# Patient Record
Sex: Female | Born: 1955 | Race: White | Hispanic: No | Marital: Married | Smoking: Former smoker
Health system: Southern US, Community
[De-identification: ages and names within clinical notes are randomized; demographics above are authoritative.]

## PROBLEM LIST (undated history)

## (undated) DIAGNOSIS — E079 Disorder of thyroid, unspecified: Secondary | ICD-10-CM

## (undated) HISTORY — PX: BREAST REDUCTION SURGERY: SHX8

## (undated) HISTORY — PX: BREAST LUMPECTOMY: SHX2

## (undated) HISTORY — PX: RECTAL SURGERY: SHX760

---

## 1983-01-26 HISTORY — PX: PARTIAL HYSTERECTOMY: SHX80

## 2008-05-11 ENCOUNTER — Emergency Department (HOSPITAL_BASED_OUTPATIENT_CLINIC_OR_DEPARTMENT_OTHER): Admission: EM | Admit: 2008-05-11 | Discharge: 2008-05-11 | Payer: Self-pay | Admitting: Emergency Medicine

## 2010-11-03 ENCOUNTER — Emergency Department (INDEPENDENT_AMBULATORY_CARE_PROVIDER_SITE_OTHER): Payer: BC Managed Care – PPO

## 2010-11-03 ENCOUNTER — Encounter: Payer: Self-pay | Admitting: Emergency Medicine

## 2010-11-03 ENCOUNTER — Emergency Department (HOSPITAL_BASED_OUTPATIENT_CLINIC_OR_DEPARTMENT_OTHER)
Admission: EM | Admit: 2010-11-03 | Discharge: 2010-11-03 | Disposition: A | Discharge status: Home / Self Care | Payer: BC Managed Care – PPO | Attending: Emergency Medicine | Admitting: Emergency Medicine

## 2010-11-03 DIAGNOSIS — R197 Diarrhea, unspecified: Secondary | ICD-10-CM

## 2010-11-03 DIAGNOSIS — Z79899 Other long term (current) drug therapy: Secondary | ICD-10-CM | POA: Insufficient documentation

## 2010-11-03 DIAGNOSIS — B379 Candidiasis, unspecified: Secondary | ICD-10-CM

## 2010-11-03 DIAGNOSIS — K6289 Other specified diseases of anus and rectum: Secondary | ICD-10-CM

## 2010-11-03 DIAGNOSIS — K649 Unspecified hemorrhoids: Secondary | ICD-10-CM

## 2010-11-03 DIAGNOSIS — K648 Other hemorrhoids: Secondary | ICD-10-CM | POA: Insufficient documentation

## 2010-11-03 HISTORY — DX: Disorder of thyroid, unspecified: E07.9

## 2010-11-03 LAB — URINALYSIS, ROUTINE W REFLEX MICROSCOPIC
Glucose, UA: NEGATIVE mg/dL
Protein, ur: NEGATIVE mg/dL
Specific Gravity, Urine: 1.022 (ref 1.005–1.030)
pH: 6 (ref 5.0–8.0)

## 2010-11-03 LAB — BASIC METABOLIC PANEL
CO2: 27 mEq/L (ref 19–32)
Calcium: 10.4 mg/dL (ref 8.4–10.5)
Chloride: 104 mEq/L (ref 96–112)
Glucose, Bld: 109 mg/dL — ABNORMAL HIGH (ref 70–99)
Potassium: 3.5 mEq/L (ref 3.5–5.1)
Sodium: 139 mEq/L (ref 135–145)

## 2010-11-03 LAB — URINE MICROSCOPIC-ADD ON

## 2010-11-03 LAB — CBC
Hemoglobin: 15.4 g/dL — ABNORMAL HIGH (ref 12.0–15.0)
Platelets: 267 10*3/uL (ref 150–400)
RBC: 5.17 MIL/uL — ABNORMAL HIGH (ref 3.87–5.11)
WBC: 8.1 10*3/uL (ref 4.0–10.5)

## 2010-11-03 LAB — OCCULT BLOOD X 1 CARD TO LAB, STOOL: Fecal Occult Bld: POSITIVE

## 2010-11-03 MED ORDER — IOHEXOL 300 MG/ML  SOLN
100.0000 mL | Freq: Once | INTRAMUSCULAR | Status: AC | PRN
  Administered 2010-11-03: 100 mL via INTRAVENOUS

## 2010-11-03 MED ORDER — CLOTRIMAZOLE 1 % EX CREA: TOPICAL_CREAM | CUTANEOUS | Status: AC

## 2010-11-03 MED ORDER — SODIUM CHLORIDE 0.9 % IV SOLN
INTRAVENOUS | Status: DC
  Administered 2010-11-03: 18:00:00 via INTRAVENOUS

## 2010-11-03 MED ORDER — HYDROCODONE-ACETAMINOPHEN 5-500 MG PO TABS: 1.0000 | ORAL_TABLET | Freq: Four times a day (QID) | ORAL | Status: AC | PRN

## 2010-11-03 MED ORDER — ONDANSETRON HCL 4 MG/2ML IJ SOLN
4.0000 mg | Freq: Once | INTRAMUSCULAR | Status: AC
  Administered 2010-11-03: 4 mg via INTRAVENOUS
  Filled 2010-11-03: qty 2

## 2010-11-03 MED ORDER — MORPHINE SULFATE 4 MG/ML IJ SOLN
4.0000 mg | Freq: Once | INTRAMUSCULAR | Status: AC
  Administered 2010-11-03: 4 mg via INTRAVENOUS
  Filled 2010-11-03: qty 1

## 2010-11-03 NOTE — ED Provider Notes (Signed)
History     CSN: [id number] Arrival date & time: 11/03/2010  4:50 PM  Chief Complaint  Patient presents with  . Rectal Pain  . Diarrhea    (Consider location/radiation/quality/duration/timing/severity/associated sxs/prior treatment) HPI Comments: Pt states that she has 3 large bm 4 days ago:pt states that she started having rectal pain at that time:pt states that it is constant and worsening after diarrhea today:pt states that she feels like the inside of her rectum is on fire:pt states that she has lower abdominal pain earlier today:pt states that she has a history of internal hemorrhoids and has not had problem in years:pt states that she has used multiple over the counter medications  Patient is a 55 y.o. female presenting with diarrhea. The history is provided by the patient. No language interpreter was used.  Diarrhea The primary symptoms include diarrhea. Primary symptoms do not include nausea or vomiting. The illness began today. The problem has been gradually worsening.    Past Medical History  Diagnosis Date  . Thyroid disease   . Migraine     Past Surgical History  Procedure Date  . Rectal surgery     History reviewed. No pertinent family history.  History  Substance Use Topics  . Smoking status: Never Smoker   . Smokeless tobacco: Not on file  . Alcohol Use: No    OB History    Grav Para Term Preterm Abortions TAB SAB Ect Mult Living                  Review of Systems  Gastrointestinal: Positive for diarrhea. Negative for nausea and vomiting.  All other systems reviewed and are negative.    Allergies  Review of patient's allergies indicates no known allergies.  Home Medications   Current Outpatient Rx  Name Route Sig Dispense Refill  . ARMODAFINIL 150 MG PO TABS Oral Take 150 mg by mouth daily.      . ARMODAFINIL 250 MG PO TABS Oral Take 250 mg by mouth daily.      Marland Kitchen EXCEDRIN PO Oral Take 2 tablets by mouth every 6 (six) hours as needed. For pain      . LEVOTHYROXINE SODIUM 100 MCG PO TABS Oral Take 100 mcg by mouth daily.      Marland Kitchen LIOTHYRONINE SODIUM 25 MCG PO TABS Oral Take 25 mcg by mouth 2 (two) times daily.     . TOPIRAMATE 100 MG PO TABS Oral Take 200 mg by mouth at bedtime.     Marland Kitchen ZOLPIDEM TARTRATE 10 MG PO TABS Oral Take 10 mg by mouth at bedtime.      Marland Kitchen ZOLPIDEM TARTRATE 5 MG PO TABS Oral Take 5 mg by mouth at bedtime as needed.       BP 129/71  Pulse 82  Temp(Src) 97.5 F (36.4 C) (Oral)  Resp 22  Ht 5\' 4"  (1.626 m)  Wt 165 lb (74.844 kg)  BMI 28.32 kg/m2  SpO2 98%  Physical Exam  Nursing note and vitals reviewed. Constitutional: She is oriented to person, place, and time. She appears well-developed and well-nourished.  Eyes: Pupils are equal, round, and reactive to light.  Neck: Normal range of motion.  Cardiovascular: Normal rate and regular rhythm.   Pulmonary/Chest: Effort normal and breath sounds normal.  Abdominal: Soft. Bowel sounds are normal.  Genitourinary:       Internal hemorrhoid noted on exam  Musculoskeletal: Normal range of motion.  Neurological: She is alert and oriented to person, place, and time.  Skin:       Pt has erythematous inflamed rash to external rectum  Psychiatric: She has a normal mood and affect.    ED Course  Procedures (including critical care time)  Labs Reviewed  CBC - Abnormal; Notable for the following:    RBC 5.17 (*)    Hemoglobin 15.4 (*)    All other components within normal limits  BASIC METABOLIC PANEL - Abnormal; Notable for the following:    Glucose, Bld 109 (*)    GFR calc non Af Amer 55 (*)    GFR calc Af Amer 63 (*)    All other components within normal limits  OCCULT BLOOD X 1 CARD TO LAB, STOOL  URINALYSIS, ROUTINE W REFLEX MICROSCOPIC   Ct Abdomen Pelvis W Contrast  11/03/2010  *RADIOLOGY REPORT*  Clinical Data: Rectal pain.  Diarrhea.  CT ABDOMEN AND PELVIS WITH CONTRAST  Technique:  Multidetector CT imaging of the abdomen and pelvis was performed  following the standard protocol during bolus administration of intravenous contrast.  Contrast: OMNIPAQUE IOHEXOL 300 MG/ML IV SOLN  Comparison: None  Findings: Lung bases are clear.  No pleural or pericardial fluid. The liver has a normal appearance without focal lesions or biliary ductal dilatation.  The spleen is normal.  The pancreas is normal. The adrenal glands are normal.  The kidneys are normal except for a 5 mm cyst at the upper pole of the left kidney.  The aorta and IVC are normal.  No retroperitoneal mass or adenopathy.  No free intraperitoneal fluid or air.  No bowel pathology seen in the abdominal portion of the scan.  The appendix is normal.  In the pelvis, the rectosigmoid region appears normal.  No evidence of colitis or proctitis.  No evidence of perirectal abscess.  There is been previous hysterectomy.  No adnexal lesion.  The bladder appears unremarkable.  Bony structures are unremarkable except for degenerative disc disease at L5-S1.  IMPRESSION: No acute or significant finding.  No rectosigmoid pathology evident.  Original Report Authenticated By: Thomasenia Sales, M.D.     No diagnosis found.    MDM  No acute rectal or abdominal finding:will treat for yeast in the area:pt okay to follow up as needed        Teressa Lower, NP 11/03/10 1919

## 2010-11-03 NOTE — ED Notes (Signed)
Pt having rectal pain since Friday.  Pt states she had three large BMs on Friday and now is having rectal pain with burning sensation.  Pt is also having diarrhea today.  No known fever.  No N/V.

## 2010-11-03 NOTE — ED Notes (Signed)
Pt informed that she needs to provide urine specimen asap.  Pt verbalized understanding of the instructions, pt is currently drinking ct contrast.  No c/o of nausea, rates pain at 8/10.

## 2010-11-10 NOTE — ED Provider Notes (Signed)
Medical screening examination/treatment/procedure(s) were performed by non-physician practitioner and as supervising physician I was immediately available for consultation/collaboration.  Hurman Horn, MD 11/10/10 2114

## 2012-03-09 IMAGING — CT CT ABD-PELV W/ CM
2 of 5 series · 16 of 46 positions shown, 18 images · IV contrast (APPLIED)
Comparison: None

CLINICAL DATA: Rectal pain.  Diarrhea.

CT ABDOMEN AND PELVIS WITH CONTRAST
TECHNIQUE: Multidetector CT imaging of the abdomen and pelvis was
performed following the standard protocol during bolus
administration of intravenous contrast.
Contrast: 100mL OMNIPAQUE IOHEXOL 300 MG/ML IV SOLN

[Series 2: abd/pelvis 5.0 b31f · axial · 0.69mm/px · z∈[-490,-100]mm · 13 of 88 slices shown, 15 images]
[im 5/88  soft-tissue]
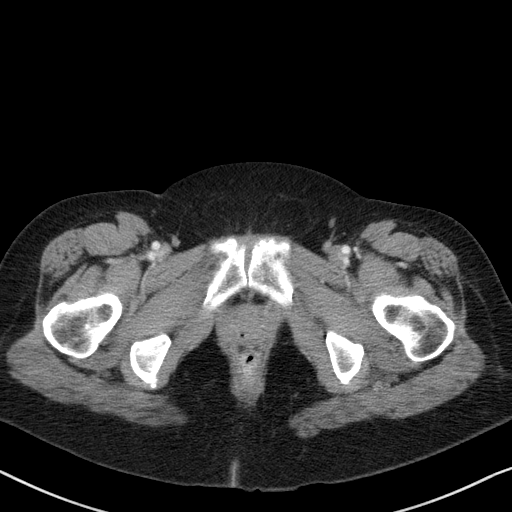
[im 5/88  bone]
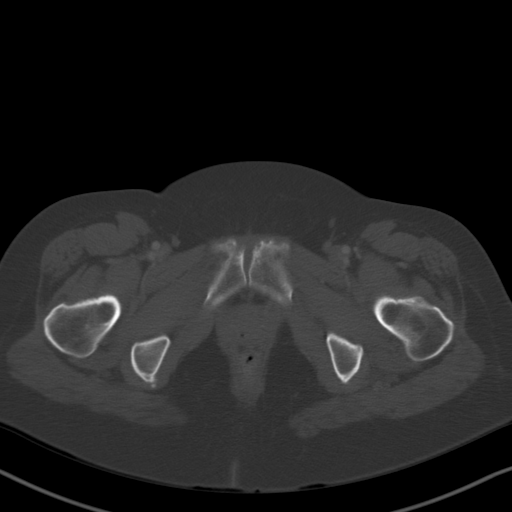
[im 14/88  soft-tissue]
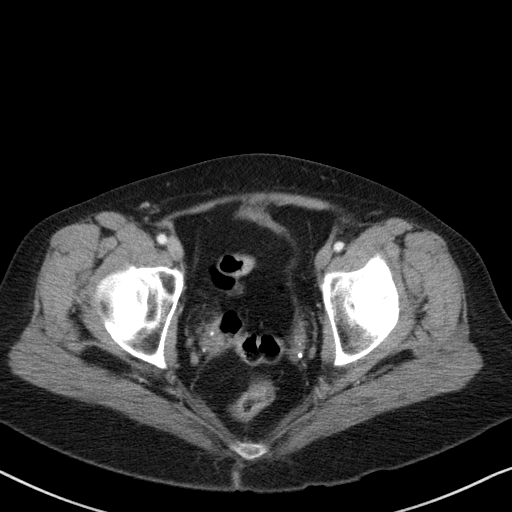
[im 19/88  soft-tissue]
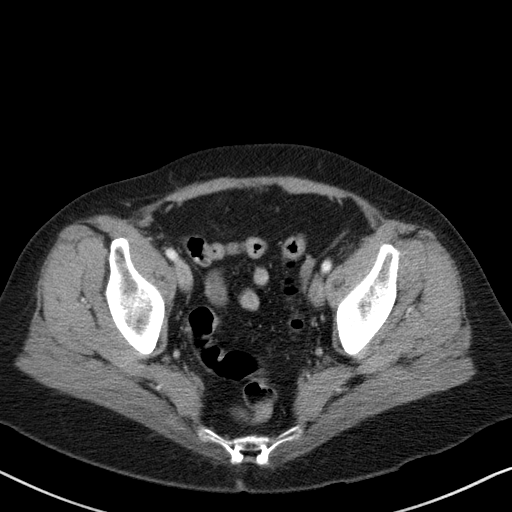
[im 23/88  soft-tissue]
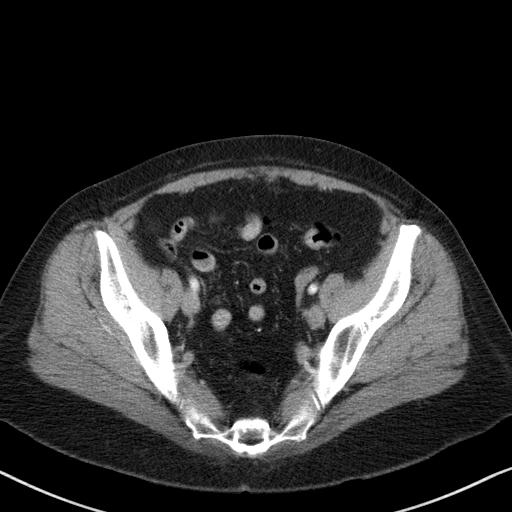
[im 33/88  soft-tissue]
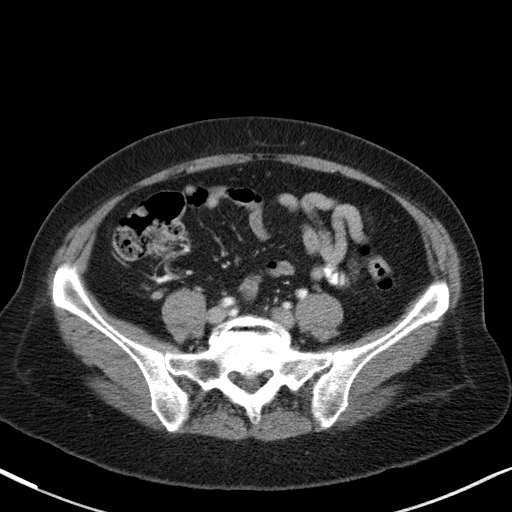
[im 37/88  soft-tissue]
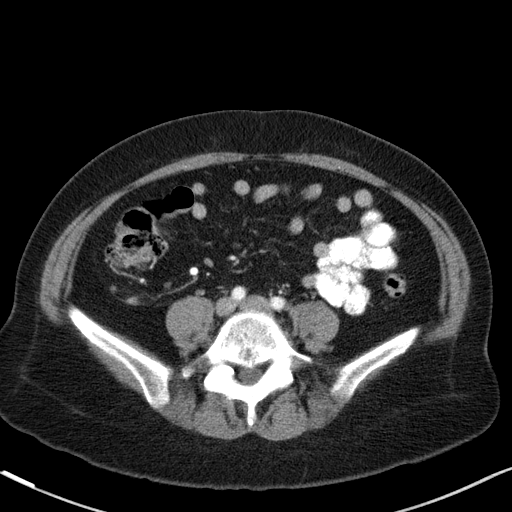
[im 46/88  soft-tissue]
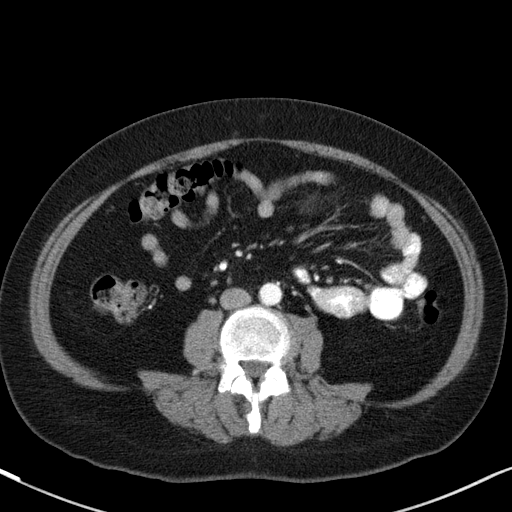
[im 51/88  soft-tissue]
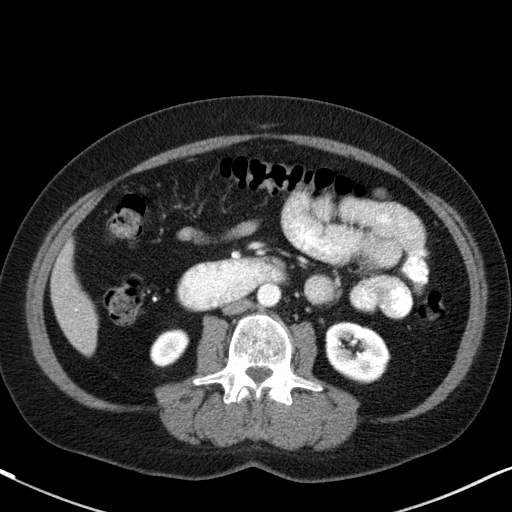
[im 55/88  soft-tissue]
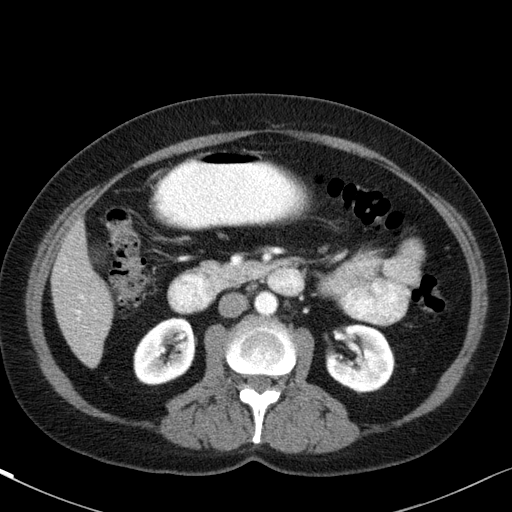
[im 55/88  bone]
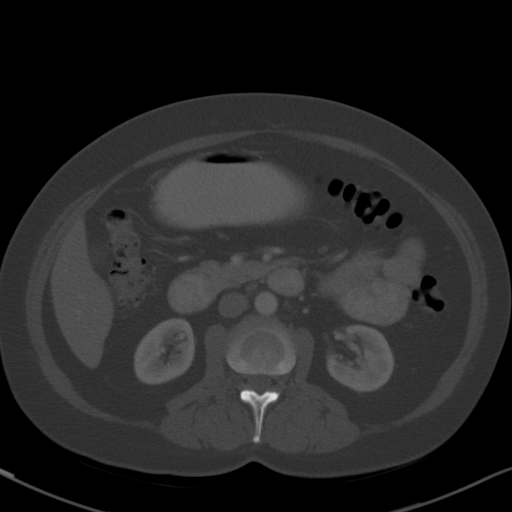
[im 65/88  soft-tissue]
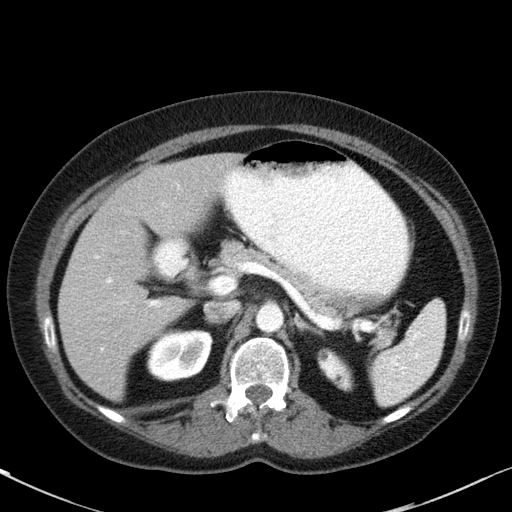
[im 69/88  soft-tissue]
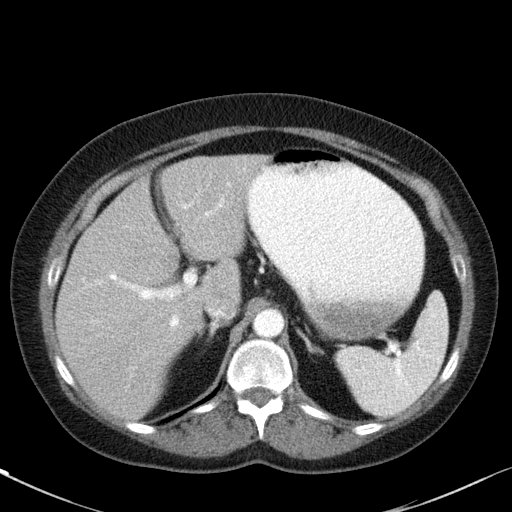
[im 74/88  soft-tissue]
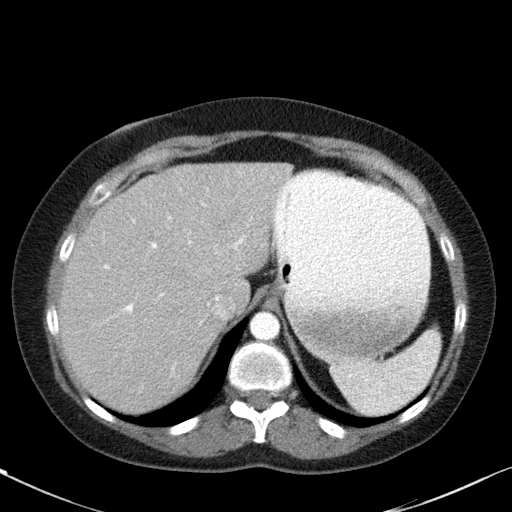
[im 83/88  soft-tissue]
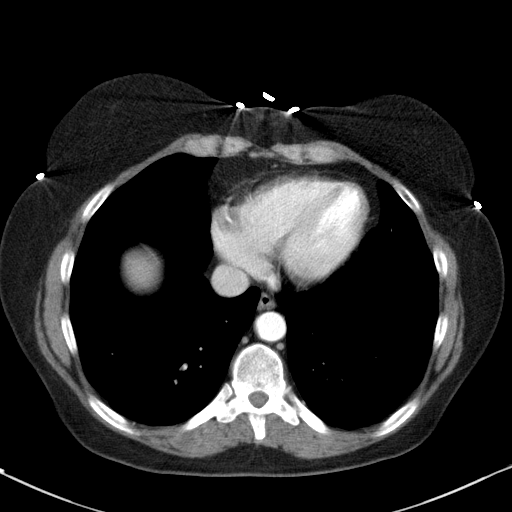

[Series 5: abd/pelvis 3.0 coronal · coronal · 0.68mm/px · 3 of 91 slices shown]
[im 31/91  soft-tissue]
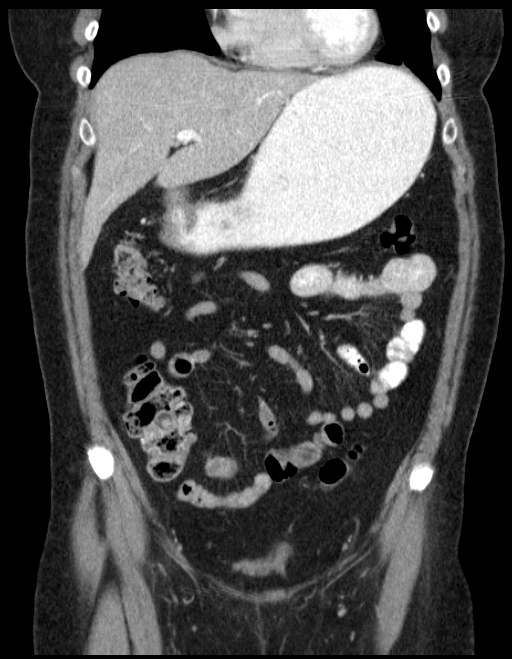
[im 41/91  soft-tissue]
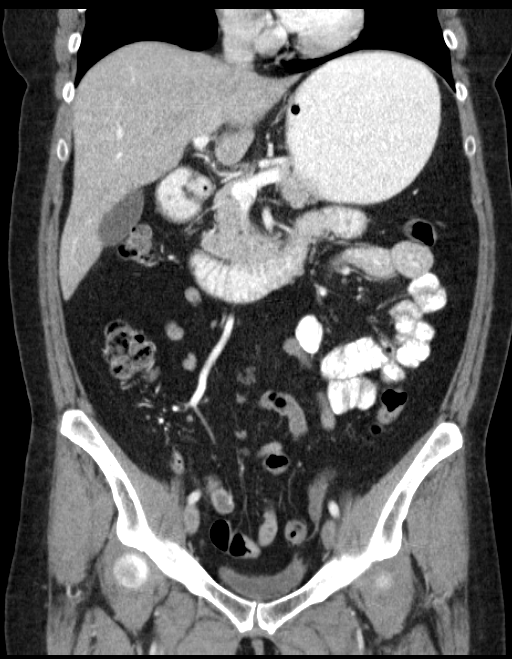
[im 51/91  soft-tissue]
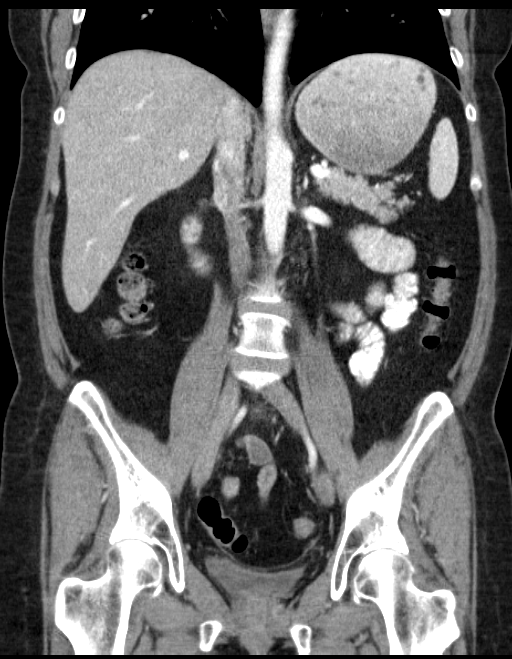

[16 of 46 positions shown; findings below may reference images not displayed]

FINDINGS: Lung bases are clear.  No pleural or pericardial fluid.
The liver has a normal appearance without focal lesions or biliary
ductal dilatation.  The spleen is normal.  The pancreas is normal.
The adrenal glands are normal.  The kidneys are normal except for a
5 mm cyst at the upper pole of the left kidney.  The aorta and IVC
are normal.  No retroperitoneal mass or adenopathy.  No free
intraperitoneal fluid or air.  No bowel pathology seen in the
abdominal portion of the scan.  The appendix is normal.

In the pelvis, the rectosigmoid region appears normal.  No evidence
of colitis or proctitis.  No evidence of perirectal abscess.  There
is been previous hysterectomy.  No adnexal lesion.  The bladder
appears unremarkable.

Bony structures are unremarkable except for degenerative disc
disease at L5-S1.
IMPRESSION: No acute or significant finding.  No rectosigmoid pathology
evident.

## 2015-03-31 DIAGNOSIS — G43709 Chronic migraine without aura, not intractable, without status migrainosus: Secondary | ICD-10-CM | POA: Insufficient documentation

## 2015-03-31 DIAGNOSIS — E782 Mixed hyperlipidemia: Secondary | ICD-10-CM | POA: Insufficient documentation

## 2015-03-31 DIAGNOSIS — N1831 Chronic kidney disease, stage 3a: Secondary | ICD-10-CM | POA: Insufficient documentation

## 2015-03-31 DIAGNOSIS — E039 Hypothyroidism, unspecified: Secondary | ICD-10-CM | POA: Insufficient documentation

## 2015-03-31 DIAGNOSIS — H8109 Meniere's disease, unspecified ear: Secondary | ICD-10-CM | POA: Insufficient documentation

## 2015-05-15 DIAGNOSIS — M51369 Other intervertebral disc degeneration, lumbar region without mention of lumbar back pain or lower extremity pain: Secondary | ICD-10-CM | POA: Insufficient documentation

## 2016-12-24 DIAGNOSIS — G8929 Other chronic pain: Secondary | ICD-10-CM | POA: Insufficient documentation

## 2017-11-18 DIAGNOSIS — H02402 Unspecified ptosis of left eyelid: Secondary | ICD-10-CM | POA: Insufficient documentation

## 2018-08-24 DIAGNOSIS — K219 Gastro-esophageal reflux disease without esophagitis: Secondary | ICD-10-CM | POA: Insufficient documentation

## 2018-10-06 DIAGNOSIS — M8589 Other specified disorders of bone density and structure, multiple sites: Secondary | ICD-10-CM | POA: Insufficient documentation

## 2020-01-23 DIAGNOSIS — N904 Leukoplakia of vulva: Secondary | ICD-10-CM | POA: Insufficient documentation

## 2020-01-23 DIAGNOSIS — G4440 Drug-induced headache, not elsewhere classified, not intractable: Secondary | ICD-10-CM | POA: Insufficient documentation

## 2020-05-05 ENCOUNTER — Encounter: Payer: Self-pay | Admitting: Neurology

## 2020-05-05 ENCOUNTER — Ambulatory Visit: Payer: BC Managed Care – PPO | Admitting: Neurology

## 2020-05-05 VITALS — BP 117/68 | HR 81 | Ht 64.0 in | Wt 178.5 lb

## 2020-05-05 DIAGNOSIS — G43709 Chronic migraine without aura, not intractable, without status migrainosus: Secondary | ICD-10-CM | POA: Diagnosis not present

## 2020-05-05 DIAGNOSIS — G4700 Insomnia, unspecified: Secondary | ICD-10-CM

## 2020-05-05 MED ORDER — SUMATRIPTAN SUCCINATE 100 MG PO TABS: ORAL_TABLET | ORAL | 3 refills | Status: DC

## 2020-05-05 MED ORDER — EMGALITY 120 MG/ML ~~LOC~~ SOAJ: 1.0000 "pen " | SUBCUTANEOUS | 4 refills | Status: DC

## 2020-05-05 NOTE — Progress Notes (Signed)
[facility]  PATIENT: Cindy Haley DOB: 1955-12-16  REFERRING DOCTOR OR PCP:  Tilford Pillar, MD (PCP) SOURCE: patient, notes from PCP  _________________________________   HISTORICAL  CHIEF COMPLAINT:  Chief Complaint  Patient presents with  . New Patient (Initial Visit)    RM 13, alone. Paper referral from Tilford Pillar, MD for migraines. Takes sumatriptan 50mg  and excedrin migraine every morning. Also has been on topamax for years. Has daily headaches. Will also wake up with headaches.     HISTORY OF PRESENT ILLNESS:  I had the pleasure of seeing your patient, Cindy Haley, at [facility] for neurologic consultation regarding her chronic migraines.  She is a 65 year old woman who has had migraine headaches > 15 years.  They became daily about 10 years ago.    A typical headache is above the right eye and has a throbbing quality.   She gets nausea and occasioanl vomiting.   She has photophobia, phonophobia and moving worsens the pain.    She wakes up most mornings with headaches.  She has HA 30/30 days and they are > 4 hours a day.    She has been taking sumatriptan 50 mg and Excedrin po qAM.   That combination helps the HA in the morning and she can usually work during tehday.  The HA may return later in the day.    She takes a triptans with benefit when a migraine occurs.     We had done Botox infusions in the past with benefit but insurance stopped covering them about 4 years ago.  When she stopped the Botox the headache frequency increased again..    She has not tried an anti-CGRP agent.      In the past, she tried and failed the following: Anti-Epileptic drugs: Depakote, Keppra and Zonisamide.   Currently on topiramate and gabapentin without much benefit Tricyclics/antidepressants: Amitriptyline, Duloxetine, Bupropion NSAIDs including piroxicam, Ibuprofen Steroid shots/pills Triptans: Imitrex (short benefit only) Opiates  (hydrocodone (short benefit only).  Muscle relaxant: Baclofen Allergy and sinus med's: Claritin, Allegra, fluticasone spray  She is otherwise in good health.      CT 12/02/2017 report read 'negative intracranial imaging'  REVIEW OF SYSTEMS: Constitutional: No fevers, chills, sweats, or change in appetite.  She has insomnia. Eyes: No visual changes, double vision, eye pain Ear, nose and throat: No hearing loss, ear pain, nasal congestion, sore throat Cardiovascular: No chest pain, palpitations Respiratory: No shortness of breath at rest or with exertion.   No wheezes GastrointestinaI: No nausea, vomiting, diarrhea, abdominal pain, fecal incontinence Genitourinary: No dysuria, urinary retention or frequency.  No nocturia. Musculoskeletal: No neck pain, back pain Integumentary: No rash, pruritus, skin lesions Neurological: as above Psychiatric: No depression at this time.  No anxiety Endocrine: No palpitations, diaphoresis, change in appetite, change in weigh or increased thirst Hematologic/Lymphatic: No anemia, purpura, petechiae. Allergic/Immunologic: No itchy/runny eyes, nasal congestion, recent allergic reactions, rashes  ALLERGIES: No Known Allergies  HOME MEDICATIONS:  Current Outpatient Medications:  .  Aspirin-Acetaminophen-Caffeine (EXCEDRIN PO), Take 2 tablets by mouth every morning. For pain, Disp: , Rfl:  .  gabapentin (NEURONTIN) 400 MG capsule, Take 800 mg by mouth at bedtime., Disp: , Rfl:  .  Galcanezumab-gnlm (EMGALITY) 120 MG/ML SOAJ, Inject 1 pen into the skin every 28 (twenty-eight) days., Disp: 3 mL, Rfl: 4 .  levothyroxine (SYNTHROID, LEVOTHROID) 100 MCG tablet, Take 100 mcg by mouth daily., Disp: , Rfl:  .  topiramate (TOPAMAX) 100 MG tablet,  Take 200 mg by mouth at bedtime., Disp: , Rfl:  .  zolpidem (AMBIEN) 10 MG tablet, Take 10 mg by mouth at bedtime., Disp: , Rfl:  .  SUMAtriptan (IMITREX) 100 MG tablet, Takes 1/2 to one tablet prn migraine.  No more  than 3 times a week, Disp: 10 tablet, Rfl: 3  PAST MEDICAL HISTORY: Past Medical History:  Diagnosis Date  . Migraine   . Thyroid disease     PAST SURGICAL HISTORY: Past Surgical History:  Procedure Laterality Date  . BREAST LUMPECTOMY     90's  . BREAST REDUCTION SURGERY     90's  . CESAREAN SECTION     1976, 1981  . PARTIAL HYSTERECTOMY  1985  . RECTAL SURGERY      FAMILY HISTORY: Family History  Problem Relation Age of Onset  . Anuerysm Father   . Kidney disease Brother   . Alcohol abuse Brother     SOCIAL HISTORY:  Social History   Socioeconomic History  . Marital status: Married    Spouse name: Renae Fickle  . Number of children: 2  . Years of education: Master's  . Highest education level: Not on file  Occupational History  . Occupation: [employer]  Tobacco Use  . Smoking status: Former Games developer  . Smokeless tobacco: Never Used  Substance and Sexual Activity  . Alcohol use: No  . Drug use: Never  . Sexual activity: Not on file  Other Topics Concern  . Not on file  Social History Narrative   2 cups coffee per day   Lives with spouse   Right handed   Social Determinants of Health   Financial Resource Strain: Not on file  Food Insecurity: Not on file  Transportation Needs: Not on file  Physical Activity: Not on file  Stress: Not on file  Social Connections: Not on file  Intimate Partner Violence: Not on file     PHYSICAL EXAM  Vitals:   05/05/20 1037  BP: 117/68  Pulse: 81  Weight: 178 lb 8 oz (81 kg)  Height: 5\' 4"  (1.626 m)    Body mass index is 30.64 kg/m.   Hearing Screening   125Hz  250Hz  500Hz  1000Hz  2000Hz  3000Hz  4000Hz  6000Hz  8000Hz   Right ear:           Left ear:             Visual Acuity Screening   Right eye Left eye Both eyes  Without correction:     With correction: 20/70 20/200 20/50    General: The patient is well-developed and well-nourished and in no acute distress  HEENT:  Head is [place]/AT.  Sclera are  anicteric.  Funduscopic exam shows normal optic discs and retinal vessels.  Neck: No carotid bruits are noted.  The neck is nontender.  Cardiovascular: The heart has a regular rate and rhythm with a normal S1 and S2. There were no murmurs, gallops or rubs.    Skin: Extremities are without rash or  edema.  Musculoskeletal:  Back is nontender  Neurologic Exam  Mental status: The patient is alert and oriented x 3 at the time of the examination. The patient has apparent normal recent and remote memory, with an apparently normal attention span and concentration ability.   Speech is normal.  Cranial nerves: Extraocular movements are full. Pupils are equal, round, and reactive to light and accomodation.  Visual fields are full.  Facial symmetry is present. There is good facial sensation to soft touch  bilaterally.Facial strength is normal.  Trapezius and sternocleidomastoid strength is normal. No dysarthria is noted.  The tongue is midline, and the patient has symmetric elevation of the soft palate. No obvious hearing deficits are noted.  Motor:  Muscle bulk is normal.   Tone is normal. Strength is  5 / 5 in all 4 extremities.   Sensory: Sensory testing is intact to pinprick, soft touch and vibration sensation in all 4 extremities.  Coordination: Cerebellar testing reveals good finger-nose-finger and heel-to-shin bilaterally.  Gait and station: Station is normal.   Gait is normal. Tandem gait is normal. Romberg is negative.   Reflexes: Deep tendon reflexes are symmetric and normal bilaterally.   Plantar responses are flexor.    DIAGNOSTIC DATA (LABS, IMAGING, TESTING) - I reviewed patient records, labs, notes, testing and imaging myself where available.  Lab Results  Component Value Date   WBC 8.1 11/03/2010   HGB 15.4 (H) 11/03/2010   HCT 44.2 11/03/2010   MCV 85.5 11/03/2010   PLT 267 11/03/2010      Component Value Date/Time   NA 139 11/03/2010 1730   K 3.5 11/03/2010 1730   CL  104 11/03/2010 1730   CO2 27 11/03/2010 1730   GLUCOSE 109 (H) 11/03/2010 1730   BUN 21 11/03/2010 1730   CREATININE 1.10 11/03/2010 1730   CALCIUM 10.4 11/03/2010 1730   GFRNONAA 55 (L) 11/03/2010 1730   GFRAA 63 (L) 11/03/2010 1730       ASSESSMENT AND PLAN  Chronic migraine w/o aura w/o status migrainosus, not intractable  Insomnia, unspecified type    In summary, Cindy Haley is a 65 year old woman with a long history of chronic migraine.  In the past, she had done best on Botox.  However, insurance stopped covering the injections a few years ago.  She has tried and failed multiple prophylactic agents.  She has not been on an agent that works on the CGRP pathways.   1.  Emgality 2 x 120 mg loading dose sample  ([id number] M   01/08/2022) and prescription for continuing dose.   Could consider Ajovy or Aimovig if preferred by insurance.  .  If this does not help will reconsider Botox.  For now she will continue the topiramate but this may be discontinued if headaches improve on Emgality or similar medication. 2.   She is advised to reserve the Sumatriptan for breakthrough migraines.  Also try to take Excedrin only when needed 3.    RTC 6 months or sooner if new or worsening neurologic issues or if anti-CGRP agents do not work and we start Botox.     Thank you for asking me to see Cindy Haley .  Please let me know if I can be of further assistance with her or other patients in the future.  Shawne Bulow A. Epimenio Foot, MD, [facility] 05/05/2020, 5:21 PM Certified in Neurology, Clinical Neurophysiology, Sleep Medicine and Neuroimaging  [facility] [address], [address] [place], [place] [address] [phone]

## 2020-06-02 ENCOUNTER — Telehealth: Payer: Self-pay | Admitting: Neurology

## 2020-06-02 ENCOUNTER — Other Ambulatory Visit: Payer: Self-pay | Admitting: *Deleted

## 2020-06-02 MED ORDER — TOPIRAMATE 100 MG PO TABS: 200.0000 mg | ORAL_TABLET | Freq: Every day | ORAL | 3 refills | Status: AC

## 2020-06-02 NOTE — Telephone Encounter (Signed)
Called pt and LVM letting her know we received message and sent in refill to pharmacy.

## 2020-06-02 NOTE — Telephone Encounter (Signed)
Pt called stating that her PCP will not fill her medication any longer and is needing her medication filled with provider here. Pt states that she is needing to have this filled before she can go in to work today. Pt is needing this filled at the [facility] in [place] on [address].

## 2020-09-24 ENCOUNTER — Other Ambulatory Visit: Payer: Self-pay | Admitting: Neurology

## 2020-11-03 ENCOUNTER — Ambulatory Visit: Payer: BC Managed Care – PPO | Admitting: Neurology

## 2020-11-10 ENCOUNTER — Other Ambulatory Visit: Payer: Self-pay

## 2020-11-10 ENCOUNTER — Encounter: Payer: Self-pay | Admitting: Neurology

## 2020-11-10 ENCOUNTER — Telehealth: Payer: Self-pay | Admitting: Neurology

## 2020-11-10 ENCOUNTER — Ambulatory Visit: Payer: BC Managed Care – PPO | Admitting: Neurology

## 2020-11-10 VITALS — BP 119/76 | HR 77 | Ht 64.0 in | Wt 177.6 lb

## 2020-11-10 DIAGNOSIS — G43709 Chronic migraine without aura, not intractable, without status migrainosus: Secondary | ICD-10-CM

## 2020-11-10 DIAGNOSIS — G4700 Insomnia, unspecified: Secondary | ICD-10-CM | POA: Diagnosis not present

## 2020-11-10 MED ORDER — DOXEPIN HCL 10 MG PO CAPS: 10.0000 mg | ORAL_CAPSULE | Freq: Every day | ORAL | 3 refills | Status: DC

## 2020-11-10 MED ORDER — SUMATRIPTAN SUCCINATE 100 MG PO TABS: ORAL_TABLET | ORAL | 3 refills | Status: DC

## 2020-11-10 NOTE — Telephone Encounter (Signed)
Received signed patient consent (given to medical records) and charge sheet for 200 units of Botox ([id number]) for chronic migraine (G43.709). MD informed me that patient will go on Medicare in the new year, he would like to make sure patient has Botox before then. We will not use a specialty pharmacy to obtain Botox because of this. Patient has BCBS [place] currently. I have filled out PA form and gave to MD to sign.

## 2020-11-10 NOTE — Progress Notes (Signed)
[facility]  PATIENT: Cindy Haley DOB: May 27, 1955  REFERRING DOCTOR OR PCP:  Tilford Pillar, MD (PCP) SOURCE: patient, notes from PCP  _________________________________   HISTORICAL  CHIEF COMPLAINT:  Chief Complaint  Patient presents with   Follow-up    New rm, alone. Here for 6 month migraine f/u. Pt reports initial dose of Emgality worked well, thereafter only causing a bruise and good for 2-3 days. Has been taking 1/2 tablet of Imitrex and 1 Excedrin and seems to work better.     HISTORY OF PRESENT ILLNESS:  Cindy Haley is a 65 y.o. woman with chronic migraines.  Update 11/10/2020: At the last visit, Emgality was tried.   Her forst month she felt she did better but then subsequent shots seemed less effective.   She has migraine 30/30 days a month.   Some days she wakes up HA free and it develops as the day goes on.   Often, she wakes up with the headache and it sometimes awakens her.     Botox has been the most effective fir the chronic migraine and she would like to get back on it.     She has insomnia and often wakes up around 4 am and then can't fall back asleep again.   She falls asleep fairly easily now but needs to take Ambien.  Without it she has trouble initiating asleep  HISTORY OF MIGRAINE She has had migraine headaches since around 2000.  They became daily about 2010/2011 years ago.    A typical headache is above the right eye and has a throbbing quality.   She gets nausea and occasioanl vomiting.   She has photophobia, phonophobia and moving worsens the pain.    She wakes up most mornings with headaches.  She has HA 30/30 days and they are > 4 hours a day.    She has been taking sumatriptan 50 mg and Excedrin po qAM.   That combination helps the HA in the morning and she can usually work during tehday.  The HA may return later in the day.    She takes a triptans with benefit when a migraine occurs.     We had done Botox infusions in the  past with benefit but insurance stopped covering them about 4 years ago.  When she stopped the Botox the headache frequency increased again..     In the past, she tried and failed the following: Anti-Epileptic drugs: Depakote, Keppra and Zonisamide.   Currently on topiramate and gabapentin without much benefit Tricyclics/antidepressants: Amitriptyline, Duloxetine, Bupropion NSAIDs including piroxicam, Ibuprofen Steroid shots/pills Triptans: Imitrex (short benefit only) Anti-CGRP agents:  Emgality only helped slightly the first month and not with subsequent months.   Opiates (hydrocodone (short benefit only).  Muscle relaxant: Baclofen Allergy and sinus med's: Claritin, Allegra, fluticasone spray  She is otherwise in good health.      CT 12/02/2017 report read 'negative intracranial imaging'  REVIEW OF SYSTEMS: Constitutional: No fevers, chills, sweats, or change in appetite.  She has insomnia. Eyes: No visual changes, double vision, eye pain Ear, nose and throat: No hearing loss, ear pain, nasal congestion, sore throat Cardiovascular: No chest pain, palpitations Respiratory:  No shortness of breath at rest or with exertion.   No wheezes GastrointestinaI: No nausea, vomiting, diarrhea, abdominal pain, fecal incontinence Genitourinary:  No dysuria, urinary retention or frequency.  No nocturia. Musculoskeletal:  No neck pain, back pain Integumentary: No rash, pruritus, skin lesions Neurological: as above Psychiatric: No depression  at this time.  No anxiety Endocrine: No palpitations, diaphoresis, change in appetite, change in weigh or increased thirst Hematologic/Lymphatic:  No anemia, purpura, petechiae. Allergic/Immunologic: No itchy/runny eyes, nasal congestion, recent allergic reactions, rashes  ALLERGIES: No Known Allergies  HOME MEDICATIONS:  Current Outpatient Medications:    Aspirin-Acetaminophen-Caffeine (EXCEDRIN PO), Take 2 tablets by mouth every morning. For pain, Disp:  , Rfl:    doxepin (SINEQUAN) 10 MG capsule, Take 1 capsule (10 mg total) by mouth at bedtime., Disp: 90 capsule, Rfl: 3   gabapentin (NEURONTIN) 400 MG capsule, Take 800 mg by mouth at bedtime., Disp: , Rfl:    Galcanezumab-gnlm (EMGALITY) 120 MG/ML SOAJ, Inject 1 pen into the skin every 28 (twenty-eight) days., Disp: 3 mL, Rfl: 4   levothyroxine (SYNTHROID, LEVOTHROID) 100 MCG tablet, Take 100 mcg by mouth daily., Disp: , Rfl:    topiramate (TOPAMAX) 100 MG tablet, Take 2 tablets (200 mg total) by mouth at bedtime., Disp: 60 tablet, Rfl: 3   zolpidem (AMBIEN) 10 MG tablet, Take 10 mg by mouth at bedtime., Disp: , Rfl:    SUMAtriptan (IMITREX) 100 MG tablet, TAKES 1/2 TO ONE TABLET AS NEEDED FOR MIGRAINE. NO MORE THAN 3 TIMES A WEEK, Disp: 30 tablet, Rfl: 3  PAST MEDICAL HISTORY: Past Medical History:  Diagnosis Date   Migraine    Thyroid disease     PAST SURGICAL HISTORY: Past Surgical History:  Procedure Laterality Date   BREAST LUMPECTOMY     90's   BREAST REDUCTION SURGERY     90's   CESAREAN SECTION     1976, 1981   PARTIAL HYSTERECTOMY  1985   RECTAL SURGERY      FAMILY HISTORY: Family History  Problem Relation Age of Onset   Anuerysm Father    Kidney disease Brother    Alcohol abuse Brother     SOCIAL HISTORY:  Social History   Socioeconomic History   Marital status: Married    Spouse name: Renae Fickle   Number of children: 2   Years of education: Master's   Highest education level: Not on file  Occupational History   Occupation: Marine scientist  Tobacco Use   Smoking status: Former   Smokeless tobacco: Never  Substance and Sexual Activity   Alcohol use: No   Drug use: Never   Sexual activity: Not on file  Other Topics Concern   Not on file  Social History Narrative   2 cups coffee per day   Lives with spouse   Right handed   Social Determinants of Health   Financial Resource Strain: Not on file  Food Insecurity: Not on file  Transportation  Needs: Not on file  Physical Activity: Not on file  Stress: Not on file  Social Connections: Not on file  Intimate Partner Violence: Not on file     PHYSICAL EXAM  Vitals:   11/10/20 1003  BP: 119/76  Pulse: 77  Weight: 177 lb 9.6 oz (80.6 kg)  Height: 5\' 4"  (1.626 m)    Body mass index is 30.48 kg/m.  No results found.   General: The patient is well-developed and well-nourished and in no acute distress  HEENT:  Head is [place]/AT.  Sclera are anicteric.    Neck: The neck is nontender.  \Skin: Extremities are without rash or  edema.  Musculoskeletal:  Back is nontender  Neurologic Exam  Mental status: The patient is alert and oriented x 3 at the time of the examination. The patient has  apparent normal recent and remote memory, with an apparently normal attention span and concentration ability.   Speech is normal.  Cranial nerves: Extraocular movements are full.  Facial strength and sensation was normal..  The tongue is midline, and the patient has symmetric elevation of the soft palate. No obvious hearing deficits are noted.  Motor:  Muscle bulk is normal.   Tone is normal. Strength is  5 / 5 in all 4 extremities.   Sensory: Sensory testing is intact to pinprick, soft touch and vibration sensation in all 4 extremities.  Coordination: Cerebellar testing reveals good finger-nose-finger and heel-to-shin bilaterally.  Gait and station: Station is normal.   Gait is normal. Tandem gait is normal. Romberg is negative.   Reflexes: Deep tendon reflexes are symmetric and normal bilaterally.   Plantar responses are flexor.    DIAGNOSTIC DATA (LABS, IMAGING, TESTING) - I reviewed patient records, labs, notes, testing and imaging myself where available.  Lab Results  Component Value Date   WBC 8.1 11/03/2010   HGB 15.4 (H) 11/03/2010   HCT 44.2 11/03/2010   MCV 85.5 11/03/2010   PLT 267 11/03/2010      Component Value Date/Time   NA 139 11/03/2010 1730   K 3.5  11/03/2010 1730   CL 104 11/03/2010 1730   CO2 27 11/03/2010 1730   GLUCOSE 109 (H) 11/03/2010 1730   BUN 21 11/03/2010 1730   CREATININE 1.10 11/03/2010 1730   CALCIUM 10.4 11/03/2010 1730   GFRNONAA 55 (L) 11/03/2010 1730   GFRAA 63 (L) 11/03/2010 1730       ASSESSMENT AND PLAN  Chronic migraine w/o aura w/o status migrainosus, not intractable  Insomnia, unspecified type  Emgality has not helped her chronic migraine.  She has failed multiple other medications.  She had done reasonably well on Botox and was on that for several years.  We will see if we can get her preauthorized to resume that medication.   Sumatriptan as needed for migraine  Doxepin 10 mg nightly for sleep maintenance insomnia RTC 6 months or sooner if new or worsening neurologic issues or if anti-CGRP agents do not work and we start Botox.      Bairon Klemann A. Epimenio Foot, MD, [facility] 11/10/2020, 3:43 PM Certified in Neurology, Clinical Neurophysiology, Sleep Medicine and Neuroimaging  [facility] [address], [address] [place], [place] [address] [phone]

## 2020-11-11 NOTE — Telephone Encounter (Signed)
Faxed PA request to [facility] with notes.

## 2020-11-12 NOTE — Telephone Encounter (Signed)
Received approval from [facility]. PA reference [facility] (11/11/20- 04/28/21). I will call patient to schedule.

## 2020-11-17 NOTE — Telephone Encounter (Signed)
I called patient to schedule Botox appointment. She states she needs a Monday d/t work. I scheduled her for 9am on 12/7.

## 2020-12-01 ENCOUNTER — Encounter: Payer: Self-pay | Admitting: Neurology

## 2020-12-01 ENCOUNTER — Ambulatory Visit: Payer: BC Managed Care – PPO | Admitting: Neurology

## 2020-12-01 DIAGNOSIS — G43709 Chronic migraine without aura, not intractable, without status migrainosus: Secondary | ICD-10-CM | POA: Diagnosis not present

## 2020-12-01 DIAGNOSIS — G4700 Insomnia, unspecified: Secondary | ICD-10-CM | POA: Diagnosis not present

## 2020-12-01 NOTE — Progress Notes (Signed)
Botox- 200 units x 1 vial Lot: [id number] Expiration: 04/25 NDC: 0023-3921-02  0.9% Sodium Chloride- 4mL total Lot: [id number] Expiration: 12/23 NDC: 63323-186-03  Dx: G43.709 B/B  

## 2020-12-01 NOTE — Progress Notes (Signed)
[facility]  PATIENT: Cindy Haley DOB: 1956-01-20  REFERRING DOCTOR OR PCP:  Tilford Pillar, MD (PCP) SOURCE: patient, notes from PCP  _________________________________   HISTORICAL  CHIEF COMPLAINT:  Chief Complaint  Patient presents with   Procedure    Rm 2, alone. Here for Botox.     HISTORY OF PRESENT ILLNESS:  Cindy Haley is a 65 y.o. woman with chronic migraines.  Update 11/10/2020: At the last visit, Emgality was tried.   Her forst month she felt she did better but then subsequent shots seemed less effective.   She has migraine 30/30 days a month.   Some days she wakes up HA free and it develops as the day goes on.   Often, she wakes up with the headache and it sometimes awakens her.     Botox has been the most effective fir the chronic migraine and she would like to get back on it.     She has insomnia and often wakes up around 4 am and then can't fall back asleep again.   She falls asleep fairly easily now but needs to take Ambien.  Without it she has trouble initiating asleep  HISTORY OF MIGRAINE She has had migraine headaches since around 2000.  They became daily about 2010/2011 years ago.    A typical headache is above the right eye and has a throbbing quality.   She gets nausea and occasioanl vomiting.   She has photophobia, phonophobia and moving worsens the pain.    She wakes up most mornings with headaches.  She has HA 30/30 days and they are > 4 hours a day.    She has been taking sumatriptan 50 mg and Excedrin po qAM.   That combination helps the HA in the morning and she can usually work during tehday.  The HA may return later in the day.    She takes a triptans with benefit when a migraine occurs.     We had done Botox infusions in the past with benefit but insurance stopped covering them about 4 years ago.  When she stopped the Botox the headache frequency increased again..     In the past, she tried and failed the  following: Anti-Epileptic drugs: Depakote, Keppra and Zonisamide.   Currently on topiramate and gabapentin without much benefit Tricyclics/antidepressants: Amitriptyline, Duloxetine, Bupropion NSAIDs including piroxicam, Ibuprofen Steroid shots/pills Triptans: Imitrex (short benefit only) Anti-CGRP agents:  Emgality only helped slightly the first month and not with subsequent months.   Opiates (hydrocodone (short benefit only).  Muscle relaxant: Baclofen Allergy and sinus med's: Claritin, Allegra, fluticasone spray  She is otherwise in good health.      CT 12/02/2017 report read 'negative intracranial imaging'  REVIEW OF SYSTEMS: Constitutional: No fevers, chills, sweats, or change in appetite.  She has insomnia. Eyes: No visual changes, double vision, eye pain Ear, nose and throat: No hearing loss, ear pain, nasal congestion, sore throat Cardiovascular: No chest pain, palpitations Respiratory:  No shortness of breath at rest or with exertion.   No wheezes GastrointestinaI: No nausea, vomiting, diarrhea, abdominal pain, fecal incontinence Genitourinary:  No dysuria, urinary retention or frequency.  No nocturia. Musculoskeletal:  No neck pain, back pain Integumentary: No rash, pruritus, skin lesions Neurological: as above Psychiatric: No depression at this time.  No anxiety Endocrine: No palpitations, diaphoresis, change in appetite, change in weigh or increased thirst Hematologic/Lymphatic:  No anemia, purpura, petechiae. Allergic/Immunologic: No itchy/runny eyes, nasal congestion, recent allergic reactions, rashes  ALLERGIES: No Known Allergies  HOME MEDICATIONS:  Current Outpatient Medications:    Aspirin-Acetaminophen-Caffeine (EXCEDRIN PO), Take 2 tablets by mouth every morning. For pain, Disp: , Rfl:    doxepin (SINEQUAN) 10 MG capsule, Take 1 capsule (10 mg total) by mouth at bedtime., Disp: 90 capsule, Rfl: 3   gabapentin (NEURONTIN) 400 MG capsule, Take 800 mg by mouth  at bedtime., Disp: , Rfl:    Galcanezumab-gnlm (EMGALITY) 120 MG/ML SOAJ, Inject 1 pen into the skin every 28 (twenty-eight) days., Disp: 3 mL, Rfl: 4   levothyroxine (SYNTHROID, LEVOTHROID) 100 MCG tablet, Take 100 mcg by mouth daily., Disp: , Rfl:    SUMAtriptan (IMITREX) 100 MG tablet, TAKES 1/2 TO ONE TABLET AS NEEDED FOR MIGRAINE. NO MORE THAN 3 TIMES A WEEK, Disp: 30 tablet, Rfl: 3   topiramate (TOPAMAX) 100 MG tablet, Take 2 tablets (200 mg total) by mouth at bedtime., Disp: 60 tablet, Rfl: 3   zolpidem (AMBIEN) 10 MG tablet, Take 10 mg by mouth at bedtime., Disp: , Rfl:   PAST MEDICAL HISTORY: Past Medical History:  Diagnosis Date   Migraine    Thyroid disease     PAST SURGICAL HISTORY: Past Surgical History:  Procedure Laterality Date   BREAST LUMPECTOMY     90's   BREAST REDUCTION SURGERY     90's   CESAREAN SECTION     1976, 1981   PARTIAL HYSTERECTOMY  1985   RECTAL SURGERY      FAMILY HISTORY: Family History  Problem Relation Age of Onset   Anuerysm Father    Kidney disease Brother    Alcohol abuse Brother     SOCIAL HISTORY:  Social History   Socioeconomic History   Marital status: Married    Spouse name: Renae Fickle   Number of children: 2   Years of education: Master's   Highest education level: Not on file  Occupational History   Occupation: Marine scientist  Tobacco Use   Smoking status: Former   Smokeless tobacco: Never  Substance and Sexual Activity   Alcohol use: No   Drug use: Never   Sexual activity: Not on file  Other Topics Concern   Not on file  Social History Narrative   2 cups coffee per day   Lives with spouse   Right handed   Social Determinants of Health   Financial Resource Strain: Not on file  Food Insecurity: Not on file  Transportation Needs: Not on file  Physical Activity: Not on file  Stress: Not on file  Social Connections: Not on file  Intimate Partner Violence: Not on file     PHYSICAL EXAM  There were  no vitals filed for this visit.   There is no height or weight on file to calculate BMI.  No results found.   General: The patient is well-developed and well-nourished and in no acute distress  HEENT:  Head is [place]/AT.  Sclera are anicteric.    Neck: The neck is nontender.  \Skin: Extremities are without rash or  edema.  Musculoskeletal:  Back is nontender  Neurologic Exam  Mental status: The patient is alert and oriented x 3 at the time of the examination. The patient has apparent normal recent and remote memory, with an apparently normal attention span and concentration ability.   Speech is normal.  Cranial nerves: Extraocular movements are full.  Facial strength and sensation was normal..  The tongue is midline, and the patient has symmetric elevation of the soft palate.  No obvious hearing deficits are noted.  Motor:  Muscle bulk is normal.   Tone is normal. Strength is  5 / 5 in all 4 extremities.   Sensory: Sensory testing is intact to pinprick, soft touch and vibration sensation in all 4 extremities.  Coordination: Cerebellar testing reveals good finger-nose-finger and heel-to-shin bilaterally.  Gait and station: Station is normal.   Gait is normal. Tandem gait is normal. Romberg is negative.   Reflexes: Deep tendon reflexes are symmetric and normal bilaterally.   Plantar responses are flexor.    DIAGNOSTIC DATA (LABS, IMAGING, TESTING) - I reviewed patient records, labs, notes, testing and imaging myself where available.  Lab Results  Component Value Date   WBC 8.1 11/03/2010   HGB 15.4 (H) 11/03/2010   HCT 44.2 11/03/2010   MCV 85.5 11/03/2010   PLT 267 11/03/2010      Component Value Date/Time   NA 139 11/03/2010 1730   K 3.5 11/03/2010 1730   CL 104 11/03/2010 1730   CO2 27 11/03/2010 1730   GLUCOSE 109 (H) 11/03/2010 1730   BUN 21 11/03/2010 1730   CREATININE 1.10 11/03/2010 1730   CALCIUM 10.4 11/03/2010 1730   GFRNONAA 55 (L) 11/03/2010 1730    GFRAA 63 (L) 11/03/2010 1730       ASSESSMENT AND PLAN  Chronic migraine w/o aura w/o status migrainosus, not intractable  Insomnia, unspecified type  Botox as follows Head/face 100 units Botox as follows: Frontalis (4 x 5 units), corrugators (2 x 5 units), nasalis (1 x 5 unit), temporalis (4 x 5 units on the right and 5 x 5 units on the left), occipitalis 4 x 5 units).   Neck 75 units Botox as follows: Splenius capitis 10 units x 2, splenius cervicis 10 units x 2, C6-C7 paraspinal muscles 7.5 units x 2, trapezius 10 units x 2 25 units wasted Sumatriptan as needed for migraine  Doxepin 10 mg nightly for sleep maintenance insomnia.  She is also on Ambien from her primary care provider RTC 6 months or sooner if new or worsening neurologic issues or if anti-CGRP agents do not work and we start Botox.      Amit Leece A. Epimenio Foot, MD, [facility] 12/01/2020, 5:17 PM Certified in Neurology, Clinical Neurophysiology, Sleep Medicine and Neuroimaging  [facility] [address] [place], [place] [address] [phone]

## 2020-12-05 ENCOUNTER — Other Ambulatory Visit: Payer: Self-pay | Admitting: Neurology

## 2020-12-09 ENCOUNTER — Telehealth: Payer: Self-pay | Admitting: Neurology

## 2020-12-09 NOTE — Telephone Encounter (Signed)
Called the patient back.  Advised that I discussed with Dr. Epimenio Foot about her concerns.  Dr. Epimenio Foot states that unfortunately with Botox there is not much that can be done to help the feeling she is having.  He states a soft collar may help to support her neck to make her feel less likely of having to hold her neck up.  He advised that he will use less next time.  Patient asked if there is anything that can be done for the pain.  Advised that she should consider using ibuprofen or aleve over the counter to help keep an anti inflammatory in her system/alternating heat and cold. She questioned if a massage would be ok and I advised that would be fine. Informed her if she does want a soft collar to let us know.

## 2020-12-09 NOTE — Telephone Encounter (Signed)
Pt called she states that ever since she got her BOTOX injection her neck and shoulders have been very tight, and she can barely hold her head up. Pt requesting a call back on what she should do.

## 2020-12-09 NOTE — Telephone Encounter (Signed)
Called the patient back. She received her injection on 11/7. Around the 10th she began to notice that it was becoming harder to keep her head up. She states that it has progressively worsen. Her neck and shoulder are very sore and could be because she is having to work hard to keep holding her head up. 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 When she is laying down, she is ok but the effort of holding her head up is bothersome. She has had botox previously before and this has never happened. Advised the patient that I would inform Dr Epimenio Foot of this and see what his thoughts and recommendations would be.

## 2021-02-12 ENCOUNTER — Telehealth: Payer: Self-pay | Admitting: Neurology

## 2021-02-12 NOTE — Telephone Encounter (Signed)
I called patient LVM to get Botox appointment rescheduled.

## 2021-02-12 NOTE — Telephone Encounter (Signed)
Patient is scheduled for a Botox injection 2/13, but Dr. Epimenio Foot will be out of the office. He has opened 2/3 for reschedules. It is okay to reschedule patient to this date, she can come as soon as 1/30 (12 weeks from last injection).

## 2021-02-19 NOTE — Telephone Encounter (Signed)
Received approval from [facility] PA #[id number] (02/19/2021-02/19/2022).

## 2021-02-19 NOTE — Telephone Encounter (Signed)
I called patient, was able to get her Botox appointment rescheduled to 04/13/2021 @2 :30. Patient did inform me that she has new insurance, provided the new insurance to me. I completed a PA form for Botox, and faxed it.

## 2021-03-09 ENCOUNTER — Ambulatory Visit: Payer: BC Managed Care – PPO | Admitting: Neurology

## 2021-04-13 ENCOUNTER — Ambulatory Visit: Payer: Medicare HMO | Admitting: Neurology

## 2021-04-13 ENCOUNTER — Encounter: Payer: Self-pay | Admitting: Neurology

## 2021-04-13 DIAGNOSIS — G4700 Insomnia, unspecified: Secondary | ICD-10-CM

## 2021-04-13 DIAGNOSIS — G43709 Chronic migraine without aura, not intractable, without status migrainosus: Secondary | ICD-10-CM

## 2021-04-13 MED ORDER — ZOLPIDEM TARTRATE 10 MG PO TABS: 10.0000 mg | ORAL_TABLET | Freq: Every day | ORAL | 5 refills | Status: DC

## 2021-04-13 NOTE — Progress Notes (Signed)
4 ? ?[facility] ? ?PATIENT: Cindy Haley ?DOB: [birth year] ? ?REFERRING DOCTOR OR PCP:  Tilford Pillar, MD (PCP) ?SOURCE: patient, notes from PCP ? ?_________________________________ ? ? ?HISTORICAL ? ?CHIEF COMPLAINT:  ?Chief Complaint  ?Patient presents with  ? Procedure  ?  Rm 16, alone. Here for Botox. Pt states Dr. Coralee Pesa would like for [place] to take over her Ambien since we are prescribing her doxepin.   ? ? ?HISTORY OF PRESENT ILLNESS:  ?Cindy Haley is a 66 y.o. woman with chronic migraines. ? ?Update 04/13/2021: ?Since she restarted Botox 12/01/2020.  Even though a similar pattern of injections was used as she tolerated well in the past she had quite a bit of neck weakness afterwards.  We discussed using much lower amount in the neck today.  Headaches have done much better on the Botox.  She had tried [employer] but that had not helped after the initial injection.  Before Botox, she had migraine 30/30 days a month.   Some days she wakes up HA free and it develops as the day goes on.   Often, she wakes up with the headache and it sometimes awakens her.   When the migraine occurs she will sometimes get nausea.  She has photophobia and phonophobia.  Movement will worsen the pain. ? ?Botox has been the most effective fir the chronic migraine and she would like to get back on it.    ? ?She has insomnia and often wakes up around 4 am and then can't fall back asleep again.   She falls asleep fairly easily with Ambien.  Without it she has trouble initiating asleep.  The addition of low-dose doxepin at night has greatly helped her sleep maintenance insomnia. ? ?HISTORY OF MIGRAINE ?She has had migraine headaches since around 2000.  They became daily about 2010/2011 years ago.    A typical headache is above the right eye and has a throbbing quality.   She gets nausea and occasioanl vomiting.   She has photophobia, phonophobia and moving worsens the pain.    She wakes up most mornings with  headaches.  She has HA 30/30 days and they are > 4 hours a day.    She has been taking sumatriptan 50 mg and Excedrin po qAM.   That combination helps the HA in the morning and she can usually work during the day.  The HA may return later in the day.   ? ?She takes a triptans with benefit when a migraine occurs.     ? ?In the past, she tried and failed the following: ?Anti-Epileptic drugs: Depakote, Keppra and Zonisamide.   Currently on topiramate and gabapentin without much benefit ?Tricyclics/antidepressants: Amitriptyline, Duloxetine, Bupropion ?NSAIDs including piroxicam, Ibuprofen ?Steroid shots/pills ?Triptans: Imitrex (short benefit only) ?Anti-CGRP agents:  Emgality only helped slightly the first month and not with subsequent months.   ?Opiates (hydrocodone (short benefit only).  ?Muscle relaxant: Baclofen ?Allergy and sinus med's: Claritin, Allegra, fluticasone spray ?Neurotoxins: Botox has been helpful for the chronic migraines ?   ? ?CT 12/02/2017 report read 'negative intracranial imaging' ? ?REVIEW OF SYSTEMS: ?Constitutional: No fevers, chills, sweats, or change in appetite.  She has insomnia. ?Eyes: No visual changes, double vision, eye pain ?Ear, nose and throat: No hearing loss, ear pain, nasal congestion, sore throat ?Cardiovascular: No chest pain, palpitations ?Respiratory:  No shortness of breath at rest or with exertion.   No wheezes ?GastrointestinaI: No nausea, vomiting, diarrhea, abdominal pain, fecal incontinence ?Genitourinary:  No dysuria,  urinary retention or frequency.  No nocturia. ?Musculoskeletal:  No neck pain, back pain ?Integumentary: No rash, pruritus, skin lesions ?Neurological: as above ?Psychiatric: No depression at this time.  No anxiety ?Endocrine: No palpitations, diaphoresis, change in appetite, change in weigh or increased thirst ?Hematologic/Lymphatic:  No anemia, purpura, petechiae. ?Allergic/Immunologic: No itchy/runny eyes, nasal congestion, recent allergic reactions,  rashes ? ?ALLERGIES: ?No Known Allergies ? ?HOME MEDICATIONS: ? ?Current Outpatient Medications:  ?  Aspirin-Acetaminophen-Caffeine (EXCEDRIN PO), Take 2 tablets by mouth every morning. For pain, Disp: , Rfl:  ?  doxepin (SINEQUAN) 10 MG capsule, Take 1 capsule (10 mg total) by mouth at bedtime., Disp: 90 capsule, Rfl: 3 ?  gabapentin (NEURONTIN) 400 MG capsule, Take 800 mg by mouth at bedtime., Disp: , Rfl:  ?  levothyroxine (SYNTHROID, LEVOTHROID) 100 MCG tablet, Take 100 mcg by mouth daily., Disp: , Rfl:  ?  SUMAtriptan (IMITREX) 100 MG tablet, TAKES 1/2 TO ONE TABLET AS NEEDED FOR MIGRAINE. NO MORE THAN 3 TIMES A WEEK, Disp: 30 tablet, Rfl: 3 ?  topiramate (TOPAMAX) 100 MG tablet, Take 2 tablets (200 mg total) by mouth at bedtime., Disp: 60 tablet, Rfl: 3 ?  zolpidem (AMBIEN) 10 MG tablet, Take 1 tablet (10 mg total) by mouth at bedtime., Disp: 30 tablet, Rfl: 5 ? ?PAST MEDICAL HISTORY: ?Past Medical History:  ?Diagnosis Date  ? Migraine   ? Thyroid disease   ? ? ?PAST SURGICAL HISTORY: ?Past Surgical History:  ?Procedure Laterality Date  ? BREAST LUMPECTOMY    ? 90's  ? BREAST REDUCTION SURGERY    ? 90's  ? CESAREAN SECTION    ? 66, 1981  ? PARTIAL HYSTERECTOMY  1985  ? RECTAL SURGERY    ? ? ?FAMILY HISTORY: ?Family History  ?Problem Relation Age of Onset  ? Anuerysm Father   ? Kidney disease Brother   ? Alcohol abuse Brother   ? ? ?SOCIAL HISTORY: ? ?Social History  ? ?Socioeconomic History  ? Marital status: Married  ?  Spouse name: Renae Fickle  ? Number of children: 2  ? Years of education: Master's  ? Highest education level: Not on file  ?Occupational History  ? Occupation: [employer]  ?Tobacco Use  ? Smoking status: Former  ? Smokeless tobacco: Never  ?Substance and Sexual Activity  ? Alcohol use: No  ? Drug use: Never  ? Sexual activity: Not on file  ?Other Topics Concern  ? Not on file  ?Social History Narrative  ? 2 cups coffee per day  ? Lives with spouse  ? Right handed  ? ?Social  Determinants of Health  ? ?Financial Resource Strain: Not on file  ?Food Insecurity: Not on file  ?Transportation Needs: Not on file  ?Physical Activity: Not on file  ?Stress: Not on file  ?Social Connections: Not on file  ?Intimate Partner Violence: Not on file  ? ? ? ?PHYSICAL EXAM ? ?There were no vitals filed for this visit. ? ? ?There is no height or weight on file to calculate BMI. ? ?No results found. ? ? ?General: The patient is well-developed and well-nourished and in no acute distress ? ?HEENT:  Head is [place]/AT.  Sclera are anicteric.   ? ?Neck: The neck is nontender.  Good range of motion ? ?Skin: Extremities are without rash or  edema. ? ?Neurologic Exam ? ?Mental status: The patient is alert and oriented x 3 at the time of the examination. The patient has apparent normal recent and remote memory,  with an apparently normal attention span and concentration ability.   Speech is normal. ? ?Cranial nerves: Extraocular movements are full.  Neck strength was normal.  Facial strength and sensation was normal..No obvious hearing deficits are noted. ? ?Motor:  Muscle bulk is normal.   Tone is normal. Strength is  5 / 5 in all 4 extremities.  ? ?Gait and station: Station is normal.   Gait is normal. Tandem gait is normal. Romberg is negative.  ? ? ? ? ?DIAGNOSTIC DATA (LABS, IMAGING, TESTING) ?- I reviewed patient records, labs, notes, testing and imaging myself where available. ? ?Lab Results  ?Component Value Date  ? WBC 8.1 11/03/2010  ? HGB 15.4 (H) 11/03/2010  ? HCT 44.2 11/03/2010  ? MCV 85.5 11/03/2010  ? PLT 267 11/03/2010  ? ?   ?Component Value Date/Time  ? NA 139 11/03/2010 1730  ? K 3.5 11/03/2010 1730  ? CL 104 11/03/2010 1730  ? CO2 27 11/03/2010 1730  ? GLUCOSE 109 (H) 11/03/2010 1730  ? BUN 21 11/03/2010 1730  ? CREATININE 1.10 11/03/2010 1730  ? CALCIUM 10.4 11/03/2010 1730  ? GFRNONAA 55 (L) 11/03/2010 1730  ? GFRAA 63 (L) 11/03/2010 1730  ? ? ? ? ? ?ASSESSMENT AND PLAN ? ?Chronic migraine w/o aura  w/o status migrainosus, not intractable ? ?Insomnia, unspecified type ? ?Botox as follows ?Head/face 100 units Botox as follows: Frontalis (4 x 5 units), corrugators (2 x 5 units), nasalis (1 x 5 unit), temporalis (4 x

## 2021-04-13 NOTE — Progress Notes (Signed)
Botox- 200 units x 1 vial ?Lot: [id number] ?Expiration: 09/2023 ?NDC: 0023-3921-02 ? ?Bacteriostatic 0.9% Sodium Chloride- 4mL total ?Lot: [id number] ?Expiration: 08/26/2022 ?NDC: 0409-1966-02 ? ?Dx: G43.709 ?B/B  ?

## 2021-07-06 ENCOUNTER — Ambulatory Visit (INDEPENDENT_AMBULATORY_CARE_PROVIDER_SITE_OTHER): Payer: Medicare HMO | Admitting: Neurology

## 2021-07-06 DIAGNOSIS — G4700 Insomnia, unspecified: Secondary | ICD-10-CM | POA: Diagnosis not present

## 2021-07-06 DIAGNOSIS — H8111 Benign paroxysmal vertigo, right ear: Secondary | ICD-10-CM | POA: Diagnosis not present

## 2021-07-06 DIAGNOSIS — G43709 Chronic migraine without aura, not intractable, without status migrainosus: Secondary | ICD-10-CM | POA: Diagnosis not present

## 2021-07-06 DIAGNOSIS — M542 Cervicalgia: Secondary | ICD-10-CM | POA: Insufficient documentation

## 2021-07-06 NOTE — Progress Notes (Signed)
[facility]  PATIENT: Cindy Haley DOB: [birth year]  REFERRING DOCTOR OR PCP:  Desiree Hane, MD (PCP) SOURCE: patient, notes from PCP  _________________________________   HISTORICAL  CHIEF COMPLAINT:  Chief Complaint  Patient presents with   Injections    Pt alone, rm 2. Here for botox. She is having dizzy spells currently and is interested in therapy to help with vestibular rehab.     HISTORY OF PRESENT ILLNESS:  Cindy Haley is a 66 y.o. woman with chronic migraines.  Update 07/06/2021: She is doing better since restarting Botox 12/01/2020.  She had some neck weakness after the late 2022 series so we cut back some of the dose in her neck.    She had no weakness after the 03/2021 series.    She did have 2 migraines a week and currently has one.  Before Botox, she had migraine 30/30 days a month.   Some days she wakes up HA free and it develops as the day goes on.   Often, she wakes up with the headache and it sometimes awakens her.   When the migraine occurs she will sometimes get nausea.  She has photophobia and phonophobia.  Movement will worsen the pain.  She takes an Imitrex about once a week for more severe migraine.    She has insomnia.   This is much better with zolpidem and doxepin .   She falls asleep fairly easily with Ambien.  Without it she has trouble initiating asleep.  The addition of low-dose doxepin at night has greatly helped her sleep maintenance insomnia.  She had the onset of positional vertigo 4 days ago.   She had this in the past and the epley maneuver helped a lot.  We discussed that if she does not get a benefit from the Epley maneuver today we could also have her try the Brandt-Daroff exercises or vestibular rehab.  HISTORY OF MIGRAINE She has had migraine headaches since around 2000.  They became daily about 2010/2011 years ago.    A typical headache is above the right eye and has a throbbing quality.   She gets nausea and  occasioanl vomiting.   She has photophobia, phonophobia and moving worsens the pain.    She wakes up most mornings with headaches.  She has HA 30/30 days and they are > 4 hours a day.    She has been taking sumatriptan 50 mg and Excedrin po qAM.   That combination helps the HA in the morning and she can usually work during the day.  The HA may return later in the day.    She takes a triptans with benefit when a migraine occurs.      In the past, she tried and failed the following: Anti-Epileptic drugs: Depakote, Keppra and Zonisamide.   Currently on topiramate and gabapentin without much benefit Tricyclics/antidepressants: Amitriptyline, Duloxetine, Bupropion NSAIDs including piroxicam, Ibuprofen Steroid shots/pills Triptans: Imitrex (short benefit only) Anti-CGRP agents:  Emgality only helped slightly the first month and not with subsequent months.   Opiates (hydrocodone (short benefit only).  Muscle relaxant: Baclofen Allergy and sinus med's: Claritin, Allegra, fluticasone spray Neurotoxins: Botox has been helpful for the chronic migraines     CT 12/02/2017 report read 'negative intracranial imaging'  REVIEW OF SYSTEMS: Constitutional: No fevers, chills, sweats, or change in appetite.  She has insomnia. Eyes: No visual changes, double vision, eye pain Ear, nose and throat: No hearing loss, ear pain, nasal congestion, sore throat Cardiovascular: No  chest pain, palpitations Respiratory:  No shortness of breath at rest or with exertion.   No wheezes GastrointestinaI: No nausea, vomiting, diarrhea, abdominal pain, fecal incontinence Genitourinary:  No dysuria, urinary retention or frequency.  No nocturia. Musculoskeletal:  No neck pain, back pain Integumentary: No rash, pruritus, skin lesions Neurological: as above Psychiatric: No depression at this time.  No anxiety Endocrine: No palpitations, diaphoresis, change in appetite, change in weigh or increased thirst Hematologic/Lymphatic:   No anemia, purpura, petechiae. Allergic/Immunologic: No itchy/runny eyes, nasal congestion, recent allergic reactions, rashes  ALLERGIES: No Known Allergies  HOME MEDICATIONS:  Current Outpatient Medications:    Aspirin-Acetaminophen-Caffeine (EXCEDRIN PO), Take 2 tablets by mouth every morning. For pain, Disp: , Rfl:    doxepin (SINEQUAN) 10 MG capsule, Take 1 capsule (10 mg total) by mouth at bedtime., Disp: 90 capsule, Rfl: 3   gabapentin (NEURONTIN) 400 MG capsule, Take 800 mg by mouth at bedtime., Disp: , Rfl:    levothyroxine (SYNTHROID, LEVOTHROID) 100 MCG tablet, Take 100 mcg by mouth daily., Disp: , Rfl:    SUMAtriptan (IMITREX) 100 MG tablet, TAKES 1/2 TO ONE TABLET AS NEEDED FOR MIGRAINE. NO MORE THAN 3 TIMES A WEEK, Disp: 30 tablet, Rfl: 3   topiramate (TOPAMAX) 100 MG tablet, Take 2 tablets (200 mg total) by mouth at bedtime., Disp: 60 tablet, Rfl: 3   zolpidem (AMBIEN) 10 MG tablet, Take 1 tablet (10 mg total) by mouth at bedtime., Disp: 30 tablet, Rfl: 5  PAST MEDICAL HISTORY: Past Medical History:  Diagnosis Date   Migraine    Thyroid disease     PAST SURGICAL HISTORY: Past Surgical History:  Procedure Laterality Date   BREAST LUMPECTOMY     90's   BREAST REDUCTION SURGERY     8's   CESAREAN SECTION     1976, 1981   PARTIAL HYSTERECTOMY  1985   RECTAL SURGERY      FAMILY HISTORY: Family History  Problem Relation Age of Onset   Anuerysm Father    Kidney disease Brother    Alcohol abuse Brother     SOCIAL HISTORY:  Social History   Socioeconomic History   Marital status: Married    Spouse name: Eddie Dibbles   Number of children: 2   Years of education: Master's   Highest education level: Not on file  Occupational History   Occupation: Financial risk analyst  Tobacco Use   Smoking status: Former   Smokeless tobacco: Never  Substance and Sexual Activity   Alcohol use: No   Drug use: Never   Sexual activity: Not on file  Other Topics Concern   Not  on file  Social History Narrative   2 cups coffee per day   Lives with spouse   Right handed   Social Determinants of Health   Financial Resource Strain: Not on file  Food Insecurity: Not on file  Transportation Needs: Not on file  Physical Activity: Not on file  Stress: Not on file  Social Connections: Not on file  Intimate Partner Violence: Not on file     PHYSICAL EXAM  There were no vitals filed for this visit.   There is no height or weight on file to calculate BMI.  No results found.   General: The patient is well-developed and well-nourished and in no acute distress  HEENT:  Head is [place]/AT.  Sclera are anicteric.    Gae Bon testing to the right evoked rotatory nystagmus and vertigo  Neck: The neck is nontender.  Good range of motion  Skin: Extremities are without rash or  edema.  Neurologic Exam  Mental status: The patient is alert and oriented x 3 at the time of the examination. The patient has apparent normal recent and remote memory, with an apparently normal attention span and concentration ability.   Speech is normal.  Cranial nerves: Extraocular movements are full.  Neck strength was normal.  Facial strength and sensation was normal..No obvious hearing deficits are noted.  Motor:  Muscle bulk is normal.   Tone is normal. Strength is  5 / 5 in all 4 extremities.   Gait and station: Station is normal.   Gait is normal. Tandem gait is normal. Romberg is negative.      DIAGNOSTIC DATA (LABS, IMAGING, TESTING) - I reviewed patient records, labs, notes, testing and imaging myself where available.  Lab Results  Component Value Date   WBC 8.1 11/03/2010   HGB 15.4 (H) 11/03/2010   HCT 44.2 11/03/2010   MCV 85.5 11/03/2010   PLT 267 11/03/2010      Component Value Date/Time   NA 139 11/03/2010 1730   K 3.5 11/03/2010 1730   CL 104 11/03/2010 1730   CO2 27 11/03/2010 1730   GLUCOSE 109 (H) 11/03/2010 1730   BUN 21 11/03/2010 1730   CREATININE  1.10 11/03/2010 1730   CALCIUM 10.4 11/03/2010 1730   GFRNONAA 55 (L) 11/03/2010 1730   GFRAA 63 (L) 11/03/2010 1730       ASSESSMENT AND PLAN  Chronic migraine w/o aura w/o status migrainosus, not intractable  Insomnia, unspecified type  Benign paroxysmal positional vertigo of right ear  Neck pain  Botox as follows Head/face 108 units Botox as follows: Frontalis (4 x 5 units), corrugators (2 x 5 units), nasalis (1 x 5 unit), temporalis (4 x 5 units on the right and 5 x 5 units on the left), occipitalis 4 x 5 units).   Obic oculi (2 x 2U x 2 bilat) Neck 50 units Botox as follows: Splenius capitis 10 units x 2,   C6-C7 paraspinal muscles 5 units x 2, trapezius 10 units x 2.    42 units wasted She has a significant headache today.  60 mg Toradol was injected intramuscularly.  Sumatriptan as needed for migraine  Continue doxepin and ambien.   The combination has been very effective for her and she has no hangover.    Epley maneuver starting with right ear down. Instructed on Brandt-Daroff exercises if notbetter could also consider referral to vestibular therapy RTC 3 months or sooner if new or worsening neurologic issues   Krystal Delduca A. Felecia Shelling, MD, [facility] [id number], [id number] PM Certified in Neurology, Clinical Neurophysiology, Sleep Medicine and Neuroimaging  [facility] [address] [place], [place] [address] [phone]

## 2021-08-30 ENCOUNTER — Other Ambulatory Visit: Payer: Self-pay | Admitting: Neurology

## 2021-08-31 NOTE — Telephone Encounter (Signed)
Last OV was on 11/10/21.  Next OV is scheduled for 10/06/21.  Last RX was written on 07/30/21 for 30 tabs.   [facility] [facility] has been reviewed.

## 2021-10-06 ENCOUNTER — Encounter: Payer: Self-pay | Admitting: Neurology

## 2021-10-06 ENCOUNTER — Ambulatory Visit: Payer: Medicare HMO | Admitting: Neurology

## 2021-10-06 VITALS — BP 111/63 | HR 84 | Ht 64.0 in | Wt 169.5 lb

## 2021-10-06 DIAGNOSIS — M542 Cervicalgia: Secondary | ICD-10-CM | POA: Diagnosis not present

## 2021-10-06 DIAGNOSIS — H8111 Benign paroxysmal vertigo, right ear: Secondary | ICD-10-CM

## 2021-10-06 DIAGNOSIS — G4700 Insomnia, unspecified: Secondary | ICD-10-CM

## 2021-10-06 DIAGNOSIS — G43709 Chronic migraine without aura, not intractable, without status migrainosus: Secondary | ICD-10-CM

## 2021-10-06 MED ORDER — SUMATRIPTAN SUCCINATE 100 MG PO TABS: ORAL_TABLET | ORAL | 3 refills | Status: DC

## 2021-10-06 MED ORDER — ONABOTULINUMTOXINA 200 UNITS IJ SOLR
155.0000 [IU] | Freq: Once | INTRAMUSCULAR | Status: AC
  Administered 2021-10-06: 155 [IU] via INTRAMUSCULAR

## 2021-10-06 MED ORDER — DOXEPIN HCL 10 MG PO CAPS: 10.0000 mg | ORAL_CAPSULE | Freq: Every day | ORAL | 3 refills | Status: DC

## 2021-10-06 NOTE — Progress Notes (Signed)
Botox 200Ux1vial, buy/bill. Lot: [id number], exp: 02/2024, NDC: 3007-6226-33.

## 2021-10-06 NOTE — Progress Notes (Signed)
4  [facility]  PATIENT: Cindy Haley DOB: 05-27-1955  REFERRING DOCTOR OR PCP:  Tilford Pillar, MD (PCP) SOURCE: patient, notes from PCP  _________________________________   HISTORICAL  CHIEF COMPLAINT:  Chief Complaint  Patient presents with   Follow-up    Pt RM #2 pt state nothing has change since the last time she was her. Pt has request refill on her Doxepin.    HISTORY OF PRESENT ILLNESS:  Cindy Haley is a 66 y.o. woman with chronic migraines.  Update 10/06/2021: Chronic migraines are doing better since restarting Botox 12/01/2020.  With the last series, she had some neck weakness (holding up) and pain and we will cut back some of the dose in her neck.    She had no weakness after the 03/2021 series.    She is still having near daily HA but fewer migraines (just 2/week).     Before Botox, she had migraine 30/30 days a month.   Some days she wakes up HA free and it develops as the day goes on.   Often, she wakes up with the headache and it sometimes awakens her.   When the migraine occurs she will sometimes get nausea.  She has photophobia and phonophobia.  Movement will worsen the pain.  She is using all her Imitrex each month and often runs out of few days only  She has insomnia -  much better with zolpidem and doxepin .   She falls asleep fairly easily with Ambien.  Without it she has trouble initiating asleep.  The addition of low-dose doxepin at night has greatly helped her sleep maintenance insomnia.  The positional vertigo is doing well.  HISTORY OF MIGRAINE She has had migraine headaches since around 2000.  They became daily about 2010/2011 years ago.    A typical headache is above the right eye and has a throbbing quality.   She gets nausea and occasioanl vomiting.   She has photophobia, phonophobia and moving worsens the pain.    She wakes up most mornings with headaches.  She has HA 30/30 days and they are > 4 hours a day.    She has been  taking sumatriptan 50 mg and Excedrin po qAM.   That combination helps the HA in the morning and she can usually work during the day.  The HA may return later in the day.    She takes a triptans with benefit when a migraine occurs.      In the past, she tried and failed the following: Anti-Epileptic drugs: Depakote, Keppra and Zonisamide.   Currently on topiramate and gabapentin without much benefit Tricyclics/antidepressants: Amitriptyline, Duloxetine, Bupropion NSAIDs including piroxicam, Ibuprofen Steroid shots/pills Triptans: Imitrex (short benefit only) Anti-CGRP agents:  Emgality only helped slightly the first month and not with subsequent months.   Opiates (hydrocodone (short benefit only).  Muscle relaxant: Baclofen Allergy and sinus med's: Claritin, Allegra, fluticasone spray Neurotoxins: Botox has been helpful for the chronic migraines     CT 12/02/2017 report read 'negative intracranial imaging'  REVIEW OF SYSTEMS: Constitutional: No fevers, chills, sweats, or change in appetite.  She has insomnia. Eyes: No visual changes, double vision, eye pain Ear, nose and throat: No hearing loss, ear pain, nasal congestion, sore throat Cardiovascular: No chest pain, palpitations Respiratory:  No shortness of breath at rest or with exertion.   No wheezes GastrointestinaI: No nausea, vomiting, diarrhea, abdominal pain, fecal incontinence Genitourinary:  No dysuria, urinary retention or frequency.  No nocturia. Musculoskeletal:  No neck pain, back pain Integumentary: No rash, pruritus, skin lesions Neurological: as above Psychiatric: No depression at this time.  No anxiety Endocrine: No palpitations, diaphoresis, change in appetite, change in weigh or increased thirst Hematologic/Lymphatic:  No anemia, purpura, petechiae. Allergic/Immunologic: No itchy/runny eyes, nasal congestion, recent allergic reactions, rashes  ALLERGIES: Not on File  HOME MEDICATIONS:  Current Outpatient  Medications:    Aspirin-Acetaminophen-Caffeine (EXCEDRIN PO), Take 2 tablets by mouth every morning. For pain, Disp: , Rfl:    gabapentin (NEURONTIN) 400 MG capsule, Take 800 mg by mouth at bedtime., Disp: , Rfl:    levothyroxine (SYNTHROID, LEVOTHROID) 100 MCG tablet, Take 100 mcg by mouth daily., Disp: , Rfl:    topiramate (TOPAMAX) 100 MG tablet, Take 2 tablets (200 mg total) by mouth at bedtime., Disp: 60 tablet, Rfl: 3   zolpidem (AMBIEN) 10 MG tablet, TAKE 1 TABLET BY MOUTH EVERYDAY AT BEDTIME, Disp: 30 tablet, Rfl: 5   doxepin (SINEQUAN) 10 MG capsule, Take 1 capsule (10 mg total) by mouth at bedtime., Disp: 90 capsule, Rfl: 3   SUMAtriptan (IMITREX) 100 MG tablet, TAKES 1/2 TO ONE TABLET AS NEEDED FOR MIGRAINE. NO MORE THAN 15 tablets A Month, Disp: 45 tablet, Rfl: 3  PAST MEDICAL HISTORY: Past Medical History:  Diagnosis Date   Migraine    Thyroid disease     PAST SURGICAL HISTORY: Past Surgical History:  Procedure Laterality Date   BREAST LUMPECTOMY     90's   BREAST REDUCTION SURGERY     90's   CESAREAN SECTION     1976, 1981   PARTIAL HYSTERECTOMY  1985   RECTAL SURGERY      FAMILY HISTORY: Family History  Problem Relation Age of Onset   Anuerysm Father    Kidney disease Brother    Alcohol abuse Brother     SOCIAL HISTORY:  Social History   Socioeconomic History   Marital status: Married    Spouse name: Renae Fickle   Number of children: 2   Years of education: Master's   Highest education level: Not on file  Occupational History   Occupation: Marine scientist  Tobacco Use   Smoking status: Former   Smokeless tobacco: Never  Substance and Sexual Activity   Alcohol use: No   Drug use: Never   Sexual activity: Not on file  Other Topics Concern   Not on file  Social History Narrative   2 cups coffee per day   Lives with spouse   Right handed   Social Determinants of Health   Financial Resource Strain: Not on file  Food Insecurity: Not on file   Transportation Needs: Not on file  Physical Activity: Not on file  Stress: Not on file  Social Connections: Not on file  Intimate Partner Violence: Not on file     PHYSICAL EXAM  Vitals:   10/06/21 1024  BP: 111/63  Pulse: 84  Weight: 169 lb 8 oz (76.9 kg)  Height: 5\' 4"  (1.626 m)     Body mass index is 29.09 kg/m.  No results found.   General: The patient is well-developed and well-nourished and in no acute distress  HEENT:  Head is [place]/AT.  Sclera are anicteric.    Neck: The neck is nontender.  Good range of motion  Skin: Extremities are without rash or  edema.  Neurologic Exam  Mental status: The patient is alert and oriented x 3 at the time of the examination. The patient has apparent normal recent  and remote memory, with an apparently normal attention span and concentration ability.   Speech is normal.  Cranial nerves: Extraocular movements are full.  Facial strength was normal.  Facial sensation was normal.  Neck strength was normal.  No obvious hearing deficits are noted.  Motor:  Muscle bulk is normal.   Tone is normal. Strength is  5 / 5 in all 4 extremities.   Gait and station: Station is normal.   Gait is normal. Tandem gait is normal. Romberg is negative.      DIAGNOSTIC DATA (LABS, IMAGING, TESTING) - I reviewed patient records, labs, notes, testing and imaging myself where available.  Lab Results  Component Value Date   WBC 8.1 11/03/2010   HGB 15.4 (H) 11/03/2010   HCT 44.2 11/03/2010   MCV 85.5 11/03/2010   PLT 267 11/03/2010      Component Value Date/Time   NA 139 11/03/2010 1730   K 3.5 11/03/2010 1730   CL 104 11/03/2010 1730   CO2 27 11/03/2010 1730   GLUCOSE 109 (H) 11/03/2010 1730   BUN 21 11/03/2010 1730   CREATININE 1.10 11/03/2010 1730   CALCIUM 10.4 11/03/2010 1730   GFRNONAA 55 (L) 11/03/2010 1730   GFRAA 63 (L) 11/03/2010 1730       ASSESSMENT AND PLAN  Chronic migraine w/o aura w/o status migrainosus, not  intractable - Plan: botulinum toxin Type A (BOTOX) injection 155 Units  Insomnia, unspecified type  Neck pain  Benign paroxysmal positional vertigo of right ear  Botox as follows Head/face 125 units Botox as follows: Frontalis (4 x 5 units), corrugators (2 x 5 units), nasalis (1 x 5 unit), temporalis (5x 5 units on the right and 6 x 5 units on the left), occipitalis 6 x 5 units).   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    45 units wasted Renew sumatriptan.  She can take up to 15 pills a month, no more than for 90-week. Continue doxepin and ambien.   The combination has been very effective for her and she has no hangover.    RTC 3 months or sooner if new or worsening neurologic issues   Rayne Cowdrey A. Epimenio Foot, MD, [facility] 10/06/2021, 6:08 PM Certified in Neurology, Clinical Neurophysiology, Sleep Medicine and Neuroimaging  [facility] [address], [address] [place], [place] [address] [phone]

## 2021-10-10 DIAGNOSIS — J3089 Other allergic rhinitis: Secondary | ICD-10-CM | POA: Insufficient documentation

## 2021-12-29 ENCOUNTER — Ambulatory Visit: Payer: Medicare HMO | Admitting: Neurology

## 2021-12-29 VITALS — BP 126/73 | HR 71 | Ht 64.0 in | Wt 162.2 lb

## 2021-12-29 DIAGNOSIS — G4700 Insomnia, unspecified: Secondary | ICD-10-CM

## 2021-12-29 DIAGNOSIS — G43709 Chronic migraine without aura, not intractable, without status migrainosus: Secondary | ICD-10-CM | POA: Diagnosis not present

## 2021-12-29 DIAGNOSIS — M542 Cervicalgia: Secondary | ICD-10-CM

## 2021-12-29 MED ORDER — ONABOTULINUMTOXINA 200 UNITS IJ SOLR
200.0000 [IU] | Freq: Once | INTRAMUSCULAR | Status: AC
  Administered 2021-12-29: 200 [IU] via INTRAMUSCULAR

## 2021-12-29 NOTE — Addendum Note (Signed)
Addended by: Despina Arias A on: 12/29/2021 12:08 PM   Modules accepted: Orders

## 2021-12-29 NOTE — Progress Notes (Addendum)
4  [facility]  PATIENT: Cindy Haley DOB: [birth year]  REFERRING DOCTOR OR PCP:  Tilford Pillar, MD (PCP) SOURCE: patient, notes from PCP  _________________________________   HISTORICAL  CHIEF COMPLAINT:  Chief Complaint  Patient presents with   Follow-up    Pt in room #10 and alone. Pt here today for botox injection for her migraines.    HISTORY OF PRESENT ILLNESS:  Cindy Haley is a 66 y.o. woman with chronic migraines.  Update 12/29/2021: She reports that her chronic migraines continue to do well since restarting Botox in November 2022.  At the last series, I reduce the amount of Botox to inject into the neck as she had had some neck weakness on the previous areas.  She felt that the change in dose was well-tolerated.     Before Botox, she had migraine 30/30 days a month.  They still occur frequent but usually mild some days she wakes up HA free and it develops as the day goes on.   Often, she wakes up with the headache and it sometimes awakens her.   When the migraine occurs she will sometimes get nausea.  She has photophobia and phonophobia.  Movement will worsen the pain.   She reports that the insomnia is generally doing well with zolpidem and doxepin .   She falls asleep fairly easily with Ambien.  Without it she has trouble initiating asleep.  The addition of low-dose doxepin at night has greatly helped her sleep maintenance insomnia.  The positional vertigo is doing well.  HISTORY OF MIGRAINE She has had migraine headaches since around 2000.  They became daily about 2010/2011 years ago.    A typical headache is above the right eye and has a throbbing quality.   She gets nausea and occasioanl vomiting.   She has photophobia, phonophobia and moving worsens the pain.    She wakes up most mornings with headaches.  She has HA 30/30 days and they are > 4 hours a day.    She has been taking sumatriptan 50 mg and Excedrin po qAM.   That combination helps  the HA in the morning and she can usually work during the day.  The HA may return later in the day.    She takes a triptans with benefit when a migraine occurs.      In the past, she tried and failed the following: Anti-Epileptic drugs: Depakote, Keppra and Zonisamide.   Currently on topiramate and gabapentin without much benefit Tricyclics/antidepressants: Amitriptyline, Duloxetine, Bupropion NSAIDs including piroxicam, Ibuprofen Steroid shots/pills Triptans: Imitrex (short benefit only) Anti-CGRP agents:  Emgality only helped slightly the first month and not with subsequent months.   Opiates (hydrocodone (short benefit only).  Muscle relaxant: Baclofen Allergy and sinus med's: Claritin, Allegra, fluticasone spray Neurotoxins: Botox has been helpful for the chronic migraines     CT 12/02/2017 report read 'negative intracranial imaging'  REVIEW OF SYSTEMS: Constitutional: No fevers, chills, sweats, or change in appetite.  She has insomnia. Eyes: No visual changes, double vision, eye pain Ear, nose and throat: No hearing loss, ear pain, nasal congestion, sore throat Cardiovascular: No chest pain, palpitations Respiratory:  No shortness of breath at rest or with exertion.   No wheezes GastrointestinaI: No nausea, vomiting, diarrhea, abdominal pain, fecal incontinence Genitourinary:  No dysuria, urinary retention or frequency.  No nocturia. Musculoskeletal:  No neck pain, back pain Integumentary: No rash, pruritus, skin lesions Neurological: as above Psychiatric: No depression at this time.  No anxiety  Endocrine: No palpitations, diaphoresis, change in appetite, change in weigh or increased thirst Hematologic/Lymphatic:  No anemia, purpura, petechiae. Allergic/Immunologic: No itchy/runny eyes, nasal congestion, recent allergic reactions, rashes  ALLERGIES: Not on File  HOME MEDICATIONS:  Current Outpatient Medications:    Aspirin-Acetaminophen-Caffeine (EXCEDRIN PO), Take 2  tablets by mouth every morning. For pain, Disp: , Rfl:    doxepin (SINEQUAN) 10 MG capsule, Take 1 capsule (10 mg total) by mouth at bedtime., Disp: 90 capsule, Rfl: 3   gabapentin (NEURONTIN) 400 MG capsule, Take 800 mg by mouth at bedtime., Disp: , Rfl:    levothyroxine (SYNTHROID, LEVOTHROID) 100 MCG tablet, Take 100 mcg by mouth daily., Disp: , Rfl:    SUMAtriptan (IMITREX) 100 MG tablet, TAKES 1/2 TO ONE TABLET AS NEEDED FOR MIGRAINE. NO MORE THAN 15 tablets A Month, Disp: 45 tablet, Rfl: 3   topiramate (TOPAMAX) 100 MG tablet, Take 2 tablets (200 mg total) by mouth at bedtime., Disp: 60 tablet, Rfl: 3   zolpidem (AMBIEN) 10 MG tablet, TAKE 1 TABLET BY MOUTH EVERYDAY AT BEDTIME, Disp: 30 tablet, Rfl: 5  PAST MEDICAL HISTORY: Past Medical History:  Diagnosis Date   Migraine    Thyroid disease     PAST SURGICAL HISTORY: Past Surgical History:  Procedure Laterality Date   BREAST LUMPECTOMY     90's   BREAST REDUCTION SURGERY     90's   CESAREAN SECTION     1976, 1981   PARTIAL HYSTERECTOMY  1985   RECTAL SURGERY      FAMILY HISTORY: Family History  Problem Relation Age of Onset   Anuerysm Father    Kidney disease Brother    Alcohol abuse Brother     SOCIAL HISTORY:  Social History   Socioeconomic History   Marital status: Married    Spouse name: Renae Fickle   Number of children: 2   Years of education: Master's   Highest education level: Not on file  Occupational History   Occupation: Marine scientist  Tobacco Use   Smoking status: Former   Smokeless tobacco: Never  Substance and Sexual Activity   Alcohol use: No   Drug use: Never   Sexual activity: Not on file  Other Topics Concern   Not on file  Social History Narrative   2 cups coffee per day   Lives with spouse   Right handed   Social Determinants of Health   Financial Resource Strain: Not on file  Food Insecurity: Not on file  Transportation Needs: Not on file  Physical Activity: Not on file   Stress: Not on file  Social Connections: Not on file  Intimate Partner Violence: Not on file     PHYSICAL EXAM  Vitals:   12/29/21 0953  BP: 126/73  Pulse: 71  Weight: 162 lb 4 oz (73.6 kg)  Height: 5\' 4"  (1.626 m)     Body mass index is 27.85 kg/m.  No results found.   General: The patient is well-developed and well-nourished and in no acute distress  HEENT:  Head is [place]/AT.  Sclera are anicteric.    Neck: The neck is nontender.  Neck strength is good.  Good range of motion  Skin: Extremities are without rash or  edema.  Neurologic Exam  Mental status: The patient is alert and oriented x 3 at the time of the examination. The patient has apparent normal recent and remote memory, with an apparently normal attention span and concentration ability.  Neck strength and facial strength  was normal.   speech is normal.  Cranial nerves: Extraocular movements are full.  Facial strength was normal.  Facial sensation was normal.  Neck strength was normal.  No obvious hearing deficits are noted.  Motor:  Muscle bulk is normal.   Tone is normal. Strength is  5 / 5 in all 4 extremities.   Gait and station: Station is normal.   Gait is normal. Tandem gait is normal. Romberg is negative.      DIAGNOSTIC DATA (LABS, IMAGING, TESTING) - I reviewed patient records, labs, notes, testing and imaging myself where available.  Lab Results  Component Value Date   WBC 8.1 11/03/2010   HGB 15.4 (H) 11/03/2010   HCT 44.2 11/03/2010   MCV 85.5 11/03/2010   PLT 267 11/03/2010      Component Value Date/Time   NA 139 11/03/2010 1730   K 3.5 11/03/2010 1730   CL 104 11/03/2010 1730   CO2 27 11/03/2010 1730   GLUCOSE 109 (H) 11/03/2010 1730   BUN 21 11/03/2010 1730   CREATININE 1.10 11/03/2010 1730   CALCIUM 10.4 11/03/2010 1730   GFRNONAA 55 (L) 11/03/2010 1730   GFRAA 63 (L) 11/03/2010 1730       ASSESSMENT AND PLAN  Chronic migraine w/o aura w/o status migrainosus, not  intractable - Plan: botulinum toxin Type A (BOTOX) injection 200 Units  Insomnia, unspecified type  Neck pain  Botox as follows Inject 125 units into the muscles of the head and face as follows : Frontalis (4 x 5 units), corrugators (2 x 5 units), nasalis (1 x 5 unit), temporalis (5x 5 units on the right and 6 x 5 units on the left), occipitalis 6 x 5 units).   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    45 units wasted Continue continue sumatriptan.    Continue doxepin and ambien.   The combination has been very effective for her and she has no hangover.    RTC 3 months or sooner if new or worsening neurologic issues   Kyrollos Cordell A. Epimenio Foot, MD, [facility] 12/29/2021, 12:08 PM Certified in Neurology, Clinical Neurophysiology, Sleep Medicine and Neuroimaging  [facility] [address], [address] [place], [place] [address] [phone]

## 2022-02-23 ENCOUNTER — Other Ambulatory Visit: Payer: Self-pay | Admitting: Neurology

## 2022-03-01 ENCOUNTER — Telehealth: Payer: Self-pay | Admitting: Neurology

## 2022-03-01 NOTE — Telephone Encounter (Signed)
Needs Botox re-auth   Chronic Migraine CPT 64615    Botox J0585 Units:200   G43.709 Chronic migraine w/o aura w/o status migrainosus, not intractable

## 2022-03-01 NOTE — Telephone Encounter (Signed)
Pt has an upcoming botox appointment scheduled for 03/31/22 and will need a new PA before appointment

## 2022-03-10 DIAGNOSIS — F419 Anxiety disorder, unspecified: Secondary | ICD-10-CM | POA: Insufficient documentation

## 2022-03-12 ENCOUNTER — Other Ambulatory Visit (HOSPITAL_COMMUNITY): Payer: Self-pay

## 2022-03-12 NOTE — Telephone Encounter (Signed)
Pharmacy Patient Advocate Encounter   Received notification from [facility] that prior authorization for Botox 200 Units is required/requested.   PA submitted on 03/12/2022 to (ins) [employer] via CoverMyMeds Key [id number] Status is pending   Also submitted via PT medical benefit to [employer]-faxed PA form and clinical notes on 03/12/2022. Will update once insurance has a chance to respond.   BOTOX ONE- Benefit Verification [id number] Submitted!

## 2022-03-12 NOTE — Telephone Encounter (Signed)
Checking for update on auth, pt scheduled for 03/31/22

## 2022-03-15 ENCOUNTER — Other Ambulatory Visit (HOSPITAL_COMMUNITY): Payer: Self-pay

## 2022-03-15 NOTE — Telephone Encounter (Signed)
Pharmacy Patient Advocate Encounter  Prior Authorization for Botox 200 Units has been approved.    PA# AUTH# [id number] Effective dates: 03/15/2022 through 03/16/2023 This will be Buy and [employer]

## 2022-03-31 ENCOUNTER — Ambulatory Visit: Payer: Medicare HMO | Admitting: Neurology

## 2022-03-31 ENCOUNTER — Encounter: Payer: Self-pay | Admitting: Neurology

## 2022-03-31 VITALS — BP 115/73 | HR 76 | Ht 64.0 in | Wt 157.0 lb

## 2022-03-31 DIAGNOSIS — M542 Cervicalgia: Secondary | ICD-10-CM | POA: Diagnosis not present

## 2022-03-31 DIAGNOSIS — G4700 Insomnia, unspecified: Secondary | ICD-10-CM | POA: Diagnosis not present

## 2022-03-31 DIAGNOSIS — G43709 Chronic migraine without aura, not intractable, without status migrainosus: Secondary | ICD-10-CM | POA: Diagnosis not present

## 2022-03-31 MED ORDER — ONABOTULINUMTOXINA 200 UNITS IJ SOLR
155.0000 [IU] | Freq: Once | INTRAMUSCULAR | Status: AC
  Administered 2022-03-31: 155 [IU] via INTRAMUSCULAR

## 2022-03-31 NOTE — Progress Notes (Signed)
[facility]  PATIENT: Cindy Haley DOB: [birth year]  REFERRING DOCTOR OR PCP:  Desiree Hane, MD (PCP) SOURCE: patient, notes from PCP  _________________________________   HISTORICAL  CHIEF COMPLAINT:  Chief Complaint  Patient presents with   Room 11    Pt is here Alone. Pt states that she had an Excedrin this morning.      HISTORY OF PRESENT ILLNESS:  Cindy Haley is a 67 y.o. woman with chronic migraines.  Update 03/31/2022: She reports that her chronic migraines continue to do well since restarting Botox in November 2022.  At the last series, I reduce the amount of Botox to inject into the neck as she had had some neck weakness on the previous areas -- this helped.  She felt that the change in dose was well-tolerated.     Before Botox, she had migraine 30/30 days a month.  They still occur frequent but usually mild some days she wakes up HA free and it develops as the day goes on.   Most HA's are most intense in the left orbital/temporal region.   Often, she wakes up with the headache and it sometimes awakens her.   When the migraine occurs she will sometimes get nausea.  She has photophobia and phonophobia.  Movement will worsen the pain.   Insomnia is much better with combination of zolpidem and doxepin .   She falls asleep fairly easily with Ambien.  Without it she has trouble initiating asleep.  The addition of low-dose doxepin at night has greatly helped her sleep maintenance insomnia.   No hangover in te morning.   .  The positional vertigo is doing well -- no recent episode.    HISTORY OF MIGRAINE She has had migraine headaches since around 2000.  They became daily about 2010/2011 years ago.    A typical headache is above the right eye and has a throbbing quality.   She gets nausea and occasioanl vomiting.   She has photophobia, phonophobia and moving worsens the pain.    She wakes up most mornings with headaches.  She has HA 30/30 days and they  are > 4 hours a day.    She has been taking sumatriptan 50 mg and Excedrin po qAM.   That combination helps the HA in the morning and she can usually work during the day.  The HA may return later in the day.    She takes a triptans with benefit when a migraine occurs.      In the past, she tried and failed the following: Anti-Epileptic drugs: Depakote, Keppra and Zonisamide.   Currently on topiramate and gabapentin without much benefit Tricyclics/antidepressants: Amitriptyline, Duloxetine, Bupropion NSAIDs including piroxicam, Ibuprofen Steroid shots/pills Triptans: Imitrex (short benefit only) Anti-CGRP agents:  Emgality only helped slightly the first month and not with subsequent months.   Opiates (hydrocodone (short benefit only).  Muscle relaxant: Baclofen Allergy and sinus med's: Claritin, Allegra, fluticasone spray Neurotoxins: Botox has been helpful for the chronic migraines     CT 12/02/2017 report read 'negative intracranial imaging'  REVIEW OF SYSTEMS: Constitutional: No fevers, chills, sweats, or change in appetite.  She has insomnia. Eyes: No visual changes, double vision, eye pain Ear, nose and throat: No hearing loss, ear pain, nasal congestion, sore throat Cardiovascular: No chest pain, palpitations Respiratory:  No shortness of breath at rest or with exertion.   No wheezes GastrointestinaI: No nausea, vomiting, diarrhea, abdominal pain, fecal incontinence Genitourinary:  No dysuria, urinary retention or  frequency.  No nocturia. Musculoskeletal:  No neck pain, back pain Integumentary: No rash, pruritus, skin lesions Neurological: as above Psychiatric: No depression at this time.  No anxiety Endocrine: No palpitations, diaphoresis, change in appetite, change in weigh or increased thirst Hematologic/Lymphatic:  No anemia, purpura, petechiae. Allergic/Immunologic: No itchy/runny eyes, nasal congestion, recent allergic reactions, rashes  ALLERGIES: Not on File  HOME  MEDICATIONS:  Current Outpatient Medications:    Aspirin-Acetaminophen-Caffeine (EXCEDRIN PO), Take 2 tablets by mouth every morning. For pain, Disp: , Rfl:    doxepin (SINEQUAN) 10 MG capsule, Take 1 capsule (10 mg total) by mouth at bedtime., Disp: 90 capsule, Rfl: 3   gabapentin (NEURONTIN) 400 MG capsule, Take 800 mg by mouth at bedtime., Disp: , Rfl:    levothyroxine (SYNTHROID, LEVOTHROID) 100 MCG tablet, Take 100 mcg by mouth daily., Disp: , Rfl:    SUMAtriptan (IMITREX) 100 MG tablet, TAKES 1/2 TO ONE TABLET AS NEEDED FOR MIGRAINE. NO MORE THAN 15 tablets A Month, Disp: 45 tablet, Rfl: 3   topiramate (TOPAMAX) 100 MG tablet, Take 2 tablets (200 mg total) by mouth at bedtime., Disp: 60 tablet, Rfl: 3   zolpidem (AMBIEN) 10 MG tablet, TAKE 1 TABLET BY MOUTH EVERYDAY AT BEDTIME, Disp: 30 tablet, Rfl: 5  PAST MEDICAL HISTORY: Past Medical History:  Diagnosis Date   Migraine    Thyroid disease     PAST SURGICAL HISTORY: Past Surgical History:  Procedure Laterality Date   BREAST LUMPECTOMY     90's   BREAST REDUCTION SURGERY     29's   CESAREAN SECTION     1976, 1981   PARTIAL HYSTERECTOMY  1985   RECTAL SURGERY      FAMILY HISTORY: Family History  Problem Relation Age of Onset   Anuerysm Father    Kidney disease Brother    Alcohol abuse Brother     SOCIAL HISTORY:  Social History   Socioeconomic History   Marital status: Married    Spouse name: Eddie Dibbles   Number of children: 2   Years of education: Master's   Highest education level: Not on file  Occupational History   Occupation: Financial risk analyst  Tobacco Use   Smoking status: Former   Smokeless tobacco: Never  Substance and Sexual Activity   Alcohol use: No   Drug use: Never   Sexual activity: Not on file  Other Topics Concern   Not on file  Social History Narrative   2 cups coffee per day   Lives with spouse   Right handed   Social Determinants of Health   Financial Resource Strain: Not on  file  Food Insecurity: Not on file  Transportation Needs: Not on file  Physical Activity: Not on file  Stress: Not on file  Social Connections: Not on file  Intimate Partner Violence: Not on file     PHYSICAL EXAM  Vitals:   03/31/22 1023  BP: 115/73  Pulse: 76  Weight: 157 lb (71.2 kg)  Height: '5\' 4"'$  (1.626 m)     Body mass index is 26.95 kg/m.  No results found.   General: The patient is well-developed and well-nourished and in no acute distress  HEENT:  Head is [place]/AT.  Sclera are anicteric.    Neck: The neck is nontender.  Neck strength is good.  Good range of motion  Skin: Extremities are without rash or  edema.  Neurologic Exam  Mental status: The patient is alert and oriented x 3 at the time  of the examination.  The patient has apparent normal recent and remote memory, with an apparently normal attention span and concentration ability.      speech is normal.  Cranial nerves: Extraocular movements are full.  No ptosis.  Facial strength was normal.  Facial sensation was normal.  Neck strength was normal.  No obvious hearing deficits are noted.  Motor:  Muscle bulk is normal.   Tone is normal. Strength is  5 / 5 in all 4 extremities.   Gait and station: Station is normal.   Gait is normal. Tandem gait is normal. Romberg is negative.      DIAGNOSTIC DATA (LABS, IMAGING, TESTING) - I reviewed patient records, labs, notes, testing and imaging myself where available.  Lab Results  Component Value Date   WBC 8.1 11/03/2010   HGB 15.4 (H) 11/03/2010   HCT 44.2 11/03/2010   MCV 85.5 11/03/2010   PLT 267 11/03/2010      Component Value Date/Time   NA 139 11/03/2010 1730   K 3.5 11/03/2010 1730   CL 104 11/03/2010 1730   CO2 27 11/03/2010 1730   GLUCOSE 109 (H) 11/03/2010 1730   BUN 21 11/03/2010 1730   CREATININE 1.10 11/03/2010 1730   CALCIUM 10.4 11/03/2010 1730   GFRNONAA 55 (L) 11/03/2010 1730   GFRAA 63 (L) 11/03/2010 1730       ASSESSMENT  AND PLAN  Chronic migraine w/o aura w/o status migrainosus, not intractable  Insomnia, unspecified type  Neck pain  Botox as follows: Inject 125 units into the muscles of the head and face as follows : Frontalis (4 x 5 units), corrugators (2 x 5 units), nasalis (1 x 5 unit), temporalis (5x 5 units on the right and 6 x 5 units on the left), occipitalis 6 x 5 units).   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    45 units wasted Continue sumatriptan prn    Continue doxepin and ambien for insomnia.   The combination has been very effective for her and she has no hangover.    RTC 3 months or sooner if new or worsening neurologic issues   Andy Allende A. Felecia Shelling, MD, [facility] [id number], [id number] AM Certified in Neurology, Clinical Neurophysiology, Sleep Medicine and Neuroimaging  [facility] [address], [place], [place] [address] [phone]

## 2022-03-31 NOTE — Progress Notes (Signed)
Botox- 200 units x 1 vial Lot: [id number] Expiration: 06/2024 NDC: [id number]  Bacteriostatic 0.9% Sodium Chloride- 46m total Lot: [id number]: 11/25 NDC: [id number] Dx: [id number]   Done by Dr. SFelecia Shelling

## 2022-06-22 ENCOUNTER — Other Ambulatory Visit: Payer: Self-pay | Admitting: Neurology

## 2022-06-22 NOTE — Telephone Encounter (Signed)
Pt last seen on 03/31/22 Botox scheduled on 07/05/22 Last filled on 06/22/22 #12 tablets (30 day supply)

## 2022-07-05 ENCOUNTER — Encounter: Payer: Self-pay | Admitting: Neurology

## 2022-07-05 ENCOUNTER — Ambulatory Visit: Payer: Medicare HMO | Admitting: Neurology

## 2022-07-05 VITALS — BP 119/72 | HR 78 | Ht 64.0 in | Wt 154.0 lb

## 2022-07-05 DIAGNOSIS — H8111 Benign paroxysmal vertigo, right ear: Secondary | ICD-10-CM | POA: Diagnosis not present

## 2022-07-05 DIAGNOSIS — G43709 Chronic migraine without aura, not intractable, without status migrainosus: Secondary | ICD-10-CM | POA: Diagnosis not present

## 2022-07-05 DIAGNOSIS — M542 Cervicalgia: Secondary | ICD-10-CM | POA: Diagnosis not present

## 2022-07-05 DIAGNOSIS — G4700 Insomnia, unspecified: Secondary | ICD-10-CM

## 2022-07-05 MED ORDER — ZOLPIDEM TARTRATE 10 MG PO TABS: ORAL_TABLET | ORAL | 1 refills | Status: DC

## 2022-07-05 MED ORDER — DOXEPIN HCL 10 MG PO CAPS: 10.0000 mg | ORAL_CAPSULE | Freq: Every day | ORAL | 3 refills | Status: DC

## 2022-07-05 MED ORDER — SUMATRIPTAN SUCCINATE 100 MG PO TABS: ORAL_TABLET | ORAL | 3 refills | Status: DC

## 2022-07-05 MED ORDER — ONABOTULINUMTOXINA 200 UNITS IJ SOLR
155.0000 [IU] | Freq: Once | INTRAMUSCULAR | Status: AC
  Administered 2022-07-05: 155 [IU] via INTRAMUSCULAR

## 2022-07-05 NOTE — Progress Notes (Signed)
Botox- 200 units x 1 vial Lot: [id number] Expiration: 06/2024 NDC: 4540-9811-91  Bacteriostatic 0.9% Sodium Chloride- * mL  Lot: [id number] Expiration: 04/26/2023 NDC: 6213-0865-78  Dx: I69.629 B/B

## 2022-07-05 NOTE — Progress Notes (Signed)
4  [facility]  PATIENT: Cindy Haley DOB: February 22, 1955  REFERRING DOCTOR OR PCP:  Tilford Pillar, MD (PCP) SOURCE: patient, notes from PCP  _________________________________   HISTORICAL  CHIEF COMPLAINT:  Chief Complaint  Patient presents with   Room 11    Pt is here Alone. Pt states that she had an Excedrin this morning.      HISTORY OF PRESENT ILLNESS:  Cindy Haley is a 67 y.o. woman with chronic migraines.  Update 07/05/22: She reports that her chronic migraines do better on Botox since she restarted in 2022.  She did not note any weakness in her neck or forehead.  She felt that the change in dose was well-tolerated.     Before Botox, she had migraine 30/30 days a month.  They still occur frequent but usually mild some days she wakes up HA free and it develops as the day goes on.   Generally more HA the last month of each cycle.   Most HA's are most intense in the left orbital/temporal region.   Often, she wakes up with the headache and it sometimes awakens her.   When the migraine occurs she will sometimes get nausea.  She has photophobia and phonophobia.  Movement will worsen the pain.   Insomnia is continuing to do well  with combination of zolpidem and doxepin .   She falls asleep fairly easily with Ambien.  Without it she has trouble initiating asleep.  The addition of low-dose doxepin at night has greatly helped her sleep maintenance insomnia.   If she wakes up for her dog in the middle of the night, she quickly falls back asleep.  No hangover in te morning.   .  The positional vertigo is still occurring intermittently usually if she bends over and gets back up.     HISTORY OF MIGRAINE She has had migraine headaches since around 2000.  They became daily about 2010/2011 years ago.    A typical headache is above the right eye and has a throbbing quality.   She gets nausea and occasioanl vomiting.   She has photophobia, phonophobia and moving worsens  the pain.    She wakes up most mornings with headaches.  She has HA 30/30 days and they are > 4 hours a day.    She has been taking sumatriptan 50 mg and Excedrin po qAM.   That combination helps the HA in the morning and she can usually work during the day.  The HA may return later in the day.    She takes a triptans with benefit when a migraine occurs.      In the past, she tried and failed the following: Anti-Epileptic drugs: Depakote, Keppra and Zonisamide.   Currently on topiramate and gabapentin without much benefit Tricyclics/antidepressants: Amitriptyline, Duloxetine, Bupropion NSAIDs including piroxicam, Ibuprofen Steroid shots/pills Triptans: Imitrex (short benefit only) Anti-CGRP agents:  Emgality only helped slightly the first month and not with subsequent months.   Opiates (hydrocodone (short benefit only).  Muscle relaxant: Baclofen Allergy and sinus med's: Claritin, Allegra, fluticasone spray Neurotoxins: Botox has been helpful for the chronic migraines     CT 12/02/2017 report read 'negative intracranial imaging'  REVIEW OF SYSTEMS: Constitutional: No fevers, chills, sweats, or change in appetite.  She has insomnia. Eyes: No visual changes, double vision, eye pain Ear, nose and throat: No hearing loss, ear pain, nasal congestion, sore throat Cardiovascular: No chest pain, palpitations Respiratory:  No shortness of breath at rest or with  exertion.   No wheezes GastrointestinaI: No nausea, vomiting, diarrhea, abdominal pain, fecal incontinence Genitourinary:  No dysuria, urinary retention or frequency.  No nocturia. Musculoskeletal:  No neck pain, back pain Integumentary: No rash, pruritus, skin lesions Neurological: as above Psychiatric: No depression at this time.  No anxiety Endocrine: No palpitations, diaphoresis, change in appetite, change in weigh or increased thirst Hematologic/Lymphatic:  No anemia, purpura, petechiae. Allergic/Immunologic: No itchy/runny eyes,  nasal congestion, recent allergic reactions, rashes  ALLERGIES: Not on File  HOME MEDICATIONS:  Current Outpatient Medications:    Aspirin-Acetaminophen-Caffeine (EXCEDRIN PO), Take 2 tablets by mouth every morning. For pain, Disp: , Rfl:    doxepin (SINEQUAN) 10 MG capsule, Take 1 capsule (10 mg total) by mouth at bedtime., Disp: 90 capsule, Rfl: 3   gabapentin (NEURONTIN) 400 MG capsule, Take 800 mg by mouth at bedtime., Disp: , Rfl:    levothyroxine (SYNTHROID, LEVOTHROID) 100 MCG tablet, Take 100 mcg by mouth daily., Disp: , Rfl:    SUMAtriptan (IMITREX) 100 MG tablet, TAKES 1/2 TO ONE TABLET AS NEEDED FOR MIGRAINE. NO MORE THAN 15 tablets A Month, Disp: 45 tablet, Rfl: 3   topiramate (TOPAMAX) 100 MG tablet, Take 2 tablets (200 mg total) by mouth at bedtime., Disp: 60 tablet, Rfl: 3   zolpidem (AMBIEN) 10 MG tablet, TAKE 1 TABLET BY MOUTH EVERYDAY AT BEDTIME, Disp: 30 tablet, Rfl: 5  PAST MEDICAL HISTORY: Past Medical History:  Diagnosis Date   Migraine    Thyroid disease     PAST SURGICAL HISTORY: Past Surgical History:  Procedure Laterality Date   BREAST LUMPECTOMY     90's   BREAST REDUCTION SURGERY     90's   CESAREAN SECTION     1976, 1981   PARTIAL HYSTERECTOMY  1985   RECTAL SURGERY      FAMILY HISTORY: Family History  Problem Relation Age of Onset   Anuerysm Father    Kidney disease Brother    Alcohol abuse Brother     SOCIAL HISTORY:  Social History   Socioeconomic History   Marital status: Married    Spouse name: Renae Fickle   Number of children: 2   Years of education: Master's   Highest education level: Not on file  Occupational History   Occupation: Marine scientist  Tobacco Use   Smoking status: Former   Smokeless tobacco: Never  Substance and Sexual Activity   Alcohol use: No   Drug use: Never   Sexual activity: Not on file  Other Topics Concern   Not on file  Social History Narrative   2 cups coffee per day   Lives with spouse    Right handed   Social Determinants of Health   Financial Resource Strain: Not on file  Food Insecurity: Not on file  Transportation Needs: Not on file  Physical Activity: Not on file  Stress: Not on file  Social Connections: Not on file  Intimate Partner Violence: Not on file     PHYSICAL EXAM  Vitals:   03/31/22 1023  BP: 115/73  Pulse: 76  Weight: 157 lb (71.2 kg)  Height: 5\' 4"  (1.626 m)     Body mass index is 26.95 kg/m.  No results found.   General: The patient is well-developed and well-nourished and in no acute distress  HEENT:  Head is [place]/AT.  Sclera are anicteric.    Neck: The neck is nontender.  The neck strength was normal and she had a good range of motion.  Skin: Extremities are without rash or  edema.  Neurologic Exam  Mental status: The patient is alert and oriented x 3 at the time of the examination.  The patient has apparent normal recent and remote memory, with an apparently normal attention span and concentration ability.      speech is normal.  Cranial nerves: Extraocular movements are full.  No ptosis.  Facial strength was normal.  Facial sensation was normal.  Neck strength was normal.  No obvious hearing deficits are noted.  Motor:  Muscle bulk is normal.   Tone is normal. Strength is  5 / 5 in all 4 extremities.   Gait and station: Station is normal.   Gait is normal. Tandem gait is normal. Romberg is negative.      DIAGNOSTIC DATA (LABS, IMAGING, TESTING) - I reviewed patient records, labs, notes, testing and imaging myself where available.  Lab Results  Component Value Date   WBC 8.1 11/03/2010   HGB 15.4 (H) 11/03/2010   HCT 44.2 11/03/2010   MCV 85.5 11/03/2010   PLT 267 11/03/2010      Component Value Date/Time   NA 139 11/03/2010 1730   K 3.5 11/03/2010 1730   CL 104 11/03/2010 1730   CO2 27 11/03/2010 1730   GLUCOSE 109 (H) 11/03/2010 1730   BUN 21 11/03/2010 1730   CREATININE 1.10 11/03/2010 1730   CALCIUM 10.4  11/03/2010 1730   GFRNONAA 55 (L) 11/03/2010 1730   GFRAA 63 (L) 11/03/2010 1730       ASSESSMENT AND PLAN  Chronic migraine w/o aura w/o status migrainosus, not intractable  Insomnia, unspecified type  Neck pain  Botox as follows: Inject 125 units into the muscles of the head and face as follows : Frontalis (4 x 5 units), corrugators (2 x 5 units), nasalis (1 x 5 unit), temporalis (5x 5 units on the right and 6 x 5 units on the left), occipitalis 6 x 5 units).   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    45 units wasted Continue sumatriptan prn I also gave a sample of Ajovy to take during the third month of the cycle to see if we can get the migraines under better control.  If she gets a significant benefit, consider switching from Botox to one of the anti-CGRP agents.    For insomnia, continue doxepin and ambien.  renew.     RTC 3 months or sooner if new or worsening neurologic issues   Oona Trammel A. Epimenio Foot, MD, [facility] 03/31/2022, 11:14 AM Certified in Neurology, Clinical Neurophysiology, Sleep Medicine and Neuroimaging  [facility] [address] [place], [place] [address] [phone]

## 2022-09-28 ENCOUNTER — Ambulatory Visit: Payer: Medicare HMO | Admitting: Neurology

## 2022-09-28 DIAGNOSIS — G43709 Chronic migraine without aura, not intractable, without status migrainosus: Secondary | ICD-10-CM | POA: Diagnosis not present

## 2022-09-28 DIAGNOSIS — G4700 Insomnia, unspecified: Secondary | ICD-10-CM | POA: Diagnosis not present

## 2022-09-28 DIAGNOSIS — H8111 Benign paroxysmal vertigo, right ear: Secondary | ICD-10-CM

## 2022-09-28 DIAGNOSIS — M542 Cervicalgia: Secondary | ICD-10-CM

## 2022-09-28 MED ORDER — SUMATRIPTAN SUCCINATE 100 MG PO TABS: ORAL_TABLET | ORAL | 11 refills | Status: DC

## 2022-09-28 MED ORDER — DOXEPIN HCL 10 MG PO CAPS: 10.0000 mg | ORAL_CAPSULE | Freq: Every day | ORAL | 3 refills | Status: DC

## 2022-09-28 MED ORDER — ONABOTULINUMTOXINA 200 UNITS IJ SOLR
155.0000 [IU] | Freq: Once | INTRAMUSCULAR | Status: AC
Start: 2022-09-28 — End: 2022-09-28
  Administered 2022-09-28: 155 [IU] via INTRAMUSCULAR

## 2022-09-28 NOTE — Progress Notes (Signed)
4  [facility]  PATIENT: Cindy Haley DOB: 1955/08/05  REFERRING DOCTOR OR PCP:  Tilford Pillar, MD (PCP) SOURCE: patient, notes from PCP  _________________________________   HISTORICAL  CHIEF COMPLAINT:  Chief Complaint  Patient presents with   Follow-up    Pt in room 10. Here for botox injection for migraines.     HISTORY OF PRESENT ILLNESS:  Cindy Haley is a 67 y.o. woman with chronic migraines.  Update 09/28/22: Her chronic migraines continue to do well since she restarted the Botox in 2022.  She tolerated the last series of injections well.  She did not note any weakness in her neck or forehead.   Before Botox, she had migraine 30/30 days a month.  She still has headaches that occur most mornings but they are fairly mild and resolved relatively quickly.  Generally more HA the last month of each cycle.   Most HA's are most intense in the left orbital/temporal region.   Often, she wakes up with the headache and it sometimes awakens her.   When the migraine occurs she will sometimes get nausea.  She has photophobia and phonophobia.  Movement will worsen the pain.  Due to some neck weakness in the past we have used a lower amount of Botox in the neck than the typical schedule   Insomnia is continuing to do well  with combination of zolpidem and doxepin .   She falls asleep fairly easily with Ambien.  Without it she has trouble initiating asleep.  The addition of low-dose doxepin at night has greatly helped her sleep maintenance insomnia.   If she wakes up for her dog in the middle of the night, she quickly falls back asleep.  No hangover in te morning.   .  She has experienced a little bit of positional vertigo, about the same as earlier this year.     HISTORY OF MIGRAINE She has had migraine headaches since around 2000.  They became daily about 2010/2011 years ago.    A typical headache is above the right eye and has a throbbing quality.   She gets nausea  and occasioanl vomiting.   She has photophobia, phonophobia and moving worsens the pain.    She wakes up most mornings with headaches.  She has HA 30/30 days and they are > 4 hours a day.    She has been taking sumatriptan 50 mg and Excedrin po qAM.   That combination helps the HA in the morning and she can usually work during the day.  The HA may return later in the day.    She takes a triptans with benefit when a migraine occurs.      In the past, she tried and failed the following: Anti-Epileptic drugs: Depakote, Keppra and Zonisamide.   Currently on topiramate and gabapentin without much benefit Tricyclics/antidepressants: Amitriptyline, Duloxetine, Bupropion NSAIDs including piroxicam, Ibuprofen Steroid shots/pills Triptans: Imitrex (short benefit only) Anti-CGRP agents:  Emgality only helped slightly the first month and not with subsequent months.   Opiates (hydrocodone (short benefit only).  Muscle relaxant: Baclofen Allergy and sinus med's: Claritin, Allegra, fluticasone spray Neurotoxins: Botox has been helpful for the chronic migraines     CT 12/02/2017 report read 'negative intracranial imaging'  REVIEW OF SYSTEMS: Constitutional: No fevers, chills, sweats, or change in appetite.  She has insomnia. Eyes: No visual changes, double vision, eye pain Ear, nose and throat: No hearing loss, ear pain, nasal congestion, sore throat Cardiovascular: No chest pain, palpitations Respiratory:  No shortness of breath at rest or with exertion.   No wheezes GastrointestinaI: No nausea, vomiting, diarrhea, abdominal pain, fecal incontinence Genitourinary:  No dysuria, urinary retention or frequency.  No nocturia. Musculoskeletal:  No neck pain, back pain Integumentary: No rash, pruritus, skin lesions Neurological: as above Psychiatric: No depression at this time.  No anxiety Endocrine: No palpitations, diaphoresis, change in appetite, change in weigh or increased  thirst Hematologic/Lymphatic:  No anemia, purpura, petechiae. Allergic/Immunologic: No itchy/runny eyes, nasal congestion, recent allergic reactions, rashes  ALLERGIES: No Known Allergies  HOME MEDICATIONS:  Current Outpatient Medications:    Aspirin-Acetaminophen-Caffeine (EXCEDRIN PO), Take 2 tablets by mouth every morning. For pain, Disp: , Rfl:    gabapentin (NEURONTIN) 400 MG capsule, Take 800 mg by mouth at bedtime., Disp: , Rfl:    levothyroxine (SYNTHROID, LEVOTHROID) 100 MCG tablet, Take 100 mcg by mouth daily., Disp: , Rfl:    topiramate (TOPAMAX) 100 MG tablet, Take 2 tablets (200 mg total) by mouth at bedtime., Disp: 60 tablet, Rfl: 3   zolpidem (AMBIEN) 10 MG tablet, TAKE 1 TABLET BY MOUTH EVERYDAY AT BEDTIME, Disp: 90 tablet, Rfl: 1   doxepin (SINEQUAN) 10 MG capsule, Take 1 capsule (10 mg total) by mouth at bedtime., Disp: 90 capsule, Rfl: 3   [START ON 07/23/2023] SUMAtriptan (IMITREX) 100 MG tablet, TAKES 1/2 TO ONE TABLET AS NEEDED FOR MIGRAINE. NO MORE THAN 3 TIMES A WEEK, Disp: 15 tablet, Rfl: 11  Current Facility-Administered Medications:    botulinum toxin Type A (BOTOX) injection 155 Units, 155 Units, Intramuscular, Once, Tayanna Talford, Pearletha Furl, MD  PAST MEDICAL HISTORY: Past Medical History:  Diagnosis Date   Migraine    Thyroid disease     PAST SURGICAL HISTORY: Past Surgical History:  Procedure Laterality Date   BREAST LUMPECTOMY     90's   BREAST REDUCTION SURGERY     90's   CESAREAN SECTION     1976, 1981   PARTIAL HYSTERECTOMY  1985   RECTAL SURGERY      FAMILY HISTORY: Family History  Problem Relation Age of Onset   Anuerysm Father    Kidney disease Brother    Alcohol abuse Brother     SOCIAL HISTORY:  Social History   Socioeconomic History   Marital status: Married    Spouse name: Renae Fickle   Number of children: 2   Years of education: Master's   Highest education level: Not on file  Occupational History   Occupation: Chiropodist  Tobacco Use   Smoking status: Former   Smokeless tobacco: Never  Substance and Sexual Activity   Alcohol use: No   Drug use: Never   Sexual activity: Not on file  Other Topics Concern   Not on file  Social History Narrative   2 cups coffee per day   Lives with spouse   Right handed   Social Determinants of Health   Financial Resource Strain: Low Risk  (08/20/2019)   [facility] prior to 03/27/2022., [facility] visits prior to 03/27/2022.   Overall Financial Resource Strain (CARDIA)    Difficulty of Paying Living Expenses: Not hard at all  Food Insecurity: No Food Insecurity (08/20/2019)   [facility] from [facility] visits prior to 03/27/2022., [facility] visits prior to 03/27/2022.   Hunger Vital Sign    Worried About Running Out of Food in the Last Year: Never true  Ran Out of Food in the Last Year: Never true  Transportation Needs: No Transportation Needs (08/20/2019)   [facility] from [facility] visits prior to 03/27/2022., [facility] visits prior to 03/27/2022.   PRAPARE - Administrator, Civil Service (Medical): No    Lack of Transportation (Non-Medical): No  Physical Activity: Unknown (08/20/2019)   Received from [facility] visits prior to 03/27/2022., [facility] visits prior to 03/27/2022.   Exercise Vital Sign    Days of Exercise per Week: 2 days    Minutes of Exercise per Session: Not on file  Stress: No Stress Concern Present (08/20/2019)   [facility] prior to 03/27/2022., [facility] visits prior to 03/27/2022.   [employer] of Occupational Health - Occupational Stress Questionnaire    Feeling of Stress : Only a little  Social Connections: Socially Integrated (08/20/2019)   [facility] from [facility] visits prior to 03/27/2022., [facility] visits prior to 03/27/2022.   Social Advertising account executive [NHANES]    Frequency of Communication with Friends and Family: More than three times a week    Frequency of Social Gatherings with Friends and Family: Twice a week    Attends Religious Services: More than 4 times per year    Active Member of [employer] or Organizations: Yes    Attends Banker Meetings: More than 4 times per year    Marital Status: Married  Catering manager Violence: Not At Risk (08/20/2019)   [facility] prior to 03/27/2022., [facility] visits prior to 03/27/2022.   Humiliation, Afraid, Rape, and Kick questionnaire    Fear of Current or Ex-Partner: No    Emotionally Abused: No    Physically Abused: No    Sexually Abused: No     PHYSICAL EXAM  There were no vitals filed for this visit.    There is no height or weight on file to calculate BMI.  No results found.   General: The patient is well-developed and well-nourished and in no acute distress  HEENT:  Head is [place]/AT.  Sclera are anicteric.    Neck: The neck is nontender.  Neck strength was normal.  Good range of motion. Skin: Extremities are without rash or  edema.  Neurologic Exam  Mental status: The patient is alert and oriented x 3 at the time of the examination.  The patient has apparent normal recent and remote memory, with an apparently normal attention span and concentration ability.      speech is normal.  Cranial nerves: Extraocular movements are full.  No ptosis.  Facial strength was normal.  Facial sensation was normal.  Neck strength was normal.  No obvious hearing deficits are noted.  Motor:  Muscle bulk is normal.   Tone is normal. Strength is  5 / 5 in all 4 extremities.   Gait and station: Station is normal.   Gait is normal. Tandem gait is normal. Romberg is negative.       DIAGNOSTIC DATA (LABS, IMAGING, TESTING) - I reviewed patient records, labs, notes, testing and imaging myself where available.  Lab Results  Component Value Date   WBC 8.1 11/03/2010   HGB 15.4 (H) 11/03/2010   HCT 44.2 11/03/2010   MCV 85.5 11/03/2010   PLT 267 11/03/2010  Component Value Date/Time   NA 139 11/03/2010 1730   K 3.5 11/03/2010 1730   CL 104 11/03/2010 1730   CO2 27 11/03/2010 1730   GLUCOSE 109 (H) 11/03/2010 1730   BUN 21 11/03/2010 1730   CREATININE 1.10 11/03/2010 1730   CALCIUM 10.4 11/03/2010 1730   GFRNONAA 55 (L) 11/03/2010 1730   GFRAA 63 (L) 11/03/2010 1730       ASSESSMENT AND PLAN  Chronic migraine w/o aura w/o status migrainosus, not intractable - Plan: botulinum toxin Type A (BOTOX) injection 155 Units  Insomnia, unspecified type  Benign paroxysmal positional vertigo of right ear  Neck pain  Inject Botox as follows: Inject 125 units into the muscles of the head and face as follows : Frontalis (4 x 5 units), corrugators (2 x 5 units), nasalis (1 x 5 unit), temporalis (5x 5 units on the right and 6 x 5 units on the left), occipitalis 6 x 5 units).   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    45 units wasted Continue sumatriptan prn  Continue doxepin and Ambien for insomnia.     RTC 3 months or sooner if new or worsening neurologic issues   Britteny Fiebelkorn A. Epimenio Foot, MD, [facility] 09/28/2022, 9:14 PM Certified in Neurology, Clinical Neurophysiology, Sleep Medicine and Neuroimaging  [facility] [address], [address] [place], [place] [address] [phone]

## 2022-09-28 NOTE — Progress Notes (Signed)
Botox- 200 units x 1 vial ZOX:[id number] Expiration: 12/2024 NDC: 9811-9147-82  Bacteriostatic 0.9% Sodium Chloride- * mL  Lot: [id number] Expiration:04/26/2023 NDC: 0865-7846-96  Dx: G43.709 S/P OR B/B Witnessed by:Dr. Epimenio Foot independently drew up sodium chloride and botox

## 2022-10-11 ENCOUNTER — Telehealth: Payer: Self-pay | Admitting: *Deleted

## 2022-11-15 ENCOUNTER — Other Ambulatory Visit: Payer: Self-pay | Admitting: Neurology

## 2022-11-15 NOTE — Telephone Encounter (Signed)
Last seen on 09/28/22 Followup 12/28/22

## 2022-11-29 NOTE — Telephone Encounter (Signed)
I submitted PA form to [facility] to ensure we still have coverage thru medical benefit since pt is B/B. Faxed completed form along with OV notes to [phone].

## 2022-12-01 NOTE — Telephone Encounter (Signed)
Received fax of approval from [facility] medical benefit.  Auth# [id number] (11/29/22-03/16/23)

## 2022-12-28 ENCOUNTER — Ambulatory Visit: Payer: Medicare HMO | Admitting: Neurology

## 2022-12-28 ENCOUNTER — Encounter: Payer: Self-pay | Admitting: Neurology

## 2022-12-28 DIAGNOSIS — G43709 Chronic migraine without aura, not intractable, without status migrainosus: Secondary | ICD-10-CM | POA: Diagnosis not present

## 2022-12-28 DIAGNOSIS — G4700 Insomnia, unspecified: Secondary | ICD-10-CM | POA: Diagnosis not present

## 2022-12-28 DIAGNOSIS — H8111 Benign paroxysmal vertigo, right ear: Secondary | ICD-10-CM | POA: Diagnosis not present

## 2022-12-28 MED ORDER — ONABOTULINUMTOXINA 200 UNITS IJ SOLR
155.0000 [IU] | Freq: Once | INTRAMUSCULAR | Status: AC
  Administered 2022-12-28: 155 [IU] via INTRAMUSCULAR

## 2022-12-28 NOTE — Progress Notes (Signed)
4  [facility]  PATIENT: Cindy Haley DOB: [birth year]  REFERRING DOCTOR OR PCP:  Tilford Pillar, MD (PCP) SOURCE: patient, notes from PCP  _________________________________   HISTORICAL  CHIEF COMPLAINT:  Chief Complaint  Patient presents with   Follow-up    Pt in room 10. Here for botox injection for migraines.     HISTORY OF PRESENT ILLNESS:  Cindy Haley is a 67 y.o. woman with chronic migraines.  Update 12/28/22: Her chronic migraines continue to do well since she restarted the Botox in 2022. The last series in September was well tolerated.     She did not note any weakness in her neck or forehead.   Before Botox, she had migraine 30/30 days a month.  She still has headaches that occur almost all mornings but they are fairly mild and resolved relatively quickly.  She has one migraine a week but a few more the last 2-3 weeks of each cycle.   Most HA's are most intense in the left orbital/temporal region.   Often, she wakes up with the headache and it sometimes awakens her.   When the migraine occurs she will sometimes get nausea.  She has photophobia and phonophobia.  Movement will worsen the pain.  Due to some neck weakness in the past we have used a lower amount of Botox in the neck than the typical schedule.  When one occurs she takes Imitrex and Excedrin and lays down.   If she falls asleep, the migraine usually is better when she wakes up.     Insomnia is continuing to do well  with combination of zolpidem and doxepin .   She falls asleep fairly easily with Ambien.  Without it she has trouble initiating asleep.  The addition of low-dose doxepin at night has greatly helped her sleep maintenance insomnia.  Since starting doxepin, she notes that if she wakes up in the middle of the night, she is able to fall back asleep again.    She sometimes experiences  positional vertigo, about the same as earlier this year and not a problem.       HISTORY OF  MIGRAINE She has had migraine headaches since around 2000.  They became daily about 2010/2011 years ago.    A typical headache is above the right eye and has a throbbing quality.   She gets nausea and occasioanl vomiting.   She has photophobia, phonophobia and moving worsens the pain.    She wakes up most mornings with headaches.  She has HA 30/30 days and they are > 4 hours a day.    She has been taking sumatriptan 50 mg and Excedrin po qAM.   That combination helps the HA in the morning and she can usually work during the day.  The HA may return later in the day.    She takes a triptans with benefit when a migraine occurs.      In the past, she tried and failed the following: Anti-Epileptic drugs: Depakote, Keppra and Zonisamide.   Currently on topiramate and gabapentin without much benefit Tricyclics/antidepressants: Amitriptyline, Duloxetine, Bupropion NSAIDs including piroxicam, Ibuprofen Steroid shots/pills Triptans: Imitrex (short benefit only) Anti-CGRP agents:  Emgality only helped slightly the first month and not with subsequent months.   Opiates (hydrocodone (short benefit only).  Muscle relaxant: Baclofen Allergy and sinus med's: Claritin, Allegra, fluticasone spray Neurotoxins: Botox has been helpful for the chronic migraines     CT 12/02/2017 report read 'negative intracranial imaging'  REVIEW OF  SYSTEMS: Constitutional: No fevers, chills, sweats, or change in appetite.  She has insomnia. Eyes: No visual changes, double vision, eye pain Ear, nose and throat: No hearing loss, ear pain, nasal congestion, sore throat Cardiovascular: No chest pain, palpitations Respiratory:  No shortness of breath at rest or with exertion.   No wheezes GastrointestinaI: No nausea, vomiting, diarrhea, abdominal pain, fecal incontinence Genitourinary:  No dysuria, urinary retention or frequency.  No nocturia. Musculoskeletal:  No neck pain, back pain Integumentary: No rash, pruritus, skin  lesions Neurological: as above Psychiatric: No depression at this time.  No anxiety Endocrine: No palpitations, diaphoresis, change in appetite, change in weigh or increased thirst Hematologic/Lymphatic:  No anemia, purpura, petechiae. Allergic/Immunologic: No itchy/runny eyes, nasal congestion, recent allergic reactions, rashes  ALLERGIES: No Known Allergies  HOME MEDICATIONS:  Current Outpatient Medications:    Aspirin-Acetaminophen-Caffeine (EXCEDRIN PO), Take 2 tablets by mouth every morning. For pain, Disp: , Rfl:    doxepin (SINEQUAN) 10 MG capsule, Take 1 capsule (10 mg total) by mouth at bedtime., Disp: 90 capsule, Rfl: 3   gabapentin (NEURONTIN) 400 MG capsule, Take 800 mg by mouth at bedtime., Disp: , Rfl:    levothyroxine (SYNTHROID, LEVOTHROID) 100 MCG tablet, Take 100 mcg by mouth daily., Disp: , Rfl:    SUMAtriptan (IMITREX) 100 MG tablet, TAKES 1/2 TO ONE TABLET AS NEEDED FOR MIGRAINE. NO MORE THAN 3 TIMES A WEEK, Disp: 15 tablet, Rfl: 1   topiramate (TOPAMAX) 100 MG tablet, Take 2 tablets (200 mg total) by mouth at bedtime., Disp: 60 tablet, Rfl: 3   zolpidem (AMBIEN) 10 MG tablet, TAKE 1 TABLET BY MOUTH EVERYDAY AT BEDTIME, Disp: 90 tablet, Rfl: 1  PAST MEDICAL HISTORY: Past Medical History:  Diagnosis Date   Migraine    Thyroid disease     PAST SURGICAL HISTORY: Past Surgical History:  Procedure Laterality Date   BREAST LUMPECTOMY     90's   BREAST REDUCTION SURGERY     90's   CESAREAN SECTION     1976, 1981   PARTIAL HYSTERECTOMY  1985   RECTAL SURGERY      FAMILY HISTORY: Family History  Problem Relation Age of Onset   Anuerysm Father    Kidney disease Brother    Alcohol abuse Brother     SOCIAL HISTORY:  Social History   Socioeconomic History   Marital status: Married    Spouse name: Renae Fickle   Number of children: 2   Years of education: Master's   Highest education level: Not on file  Occupational History   Occupation: Chiropodist  Tobacco Use   Smoking status: Former   Smokeless tobacco: Never  Substance and Sexual Activity   Alcohol use: No   Drug use: Never   Sexual activity: Not on file  Other Topics Concern   Not on file  Social History Narrative   2 cups coffee per day   Lives with spouse   Right handed   Social Determinants of Health   Financial Resource Strain: Low Risk  (08/20/2019)   [facility] prior to 03/27/2022., [facility] visits prior to 03/27/2022.   Overall Financial Resource Strain (CARDIA)    Difficulty of Paying Living Expenses: Not hard at all  Food Insecurity: No Food Insecurity (08/20/2019)   [facility] visits prior to 03/27/2022., [facility] visits prior to 03/27/2022.   Hunger Vital Sign  Worried About Programme researcher, broadcasting/film/video in the Last Year: Never true    Ran Out of Food in the Last Year: Never true  Transportation Needs: No Transportation Needs (08/20/2019)   Received from [facility] visits prior to 03/27/2022., [facility] visits prior to 03/27/2022.   PRAPARE - Administrator, Civil Service (Medical): No    Lack of Transportation (Non-Medical): No  Physical Activity: Unknown (08/20/2019)   Received from [facility] visits prior to 03/27/2022., [facility] visits prior to 03/27/2022.   Exercise Vital Sign    Days of Exercise per Week: 2 days    Minutes of Exercise per Session: Not on file  Stress: No Stress Concern Present (08/20/2019)   [facility] visits prior to 03/27/2022., [facility] visits prior to 03/27/2022.   [employer] of Occupational Health - Occupational Stress Questionnaire    Feeling of Stress : Only a little  Social Connections: Socially Integrated (08/20/2019)   Received from [facility] visits prior to 03/27/2022., [facility] visits prior to 03/27/2022.   Social Advertising account executive [NHANES]    Frequency of Communication with Friends and Family: More than three times a week    Frequency of Social Gatherings with Friends and Family: Twice a week    Attends Religious Services: More than 4 times per year    Active Member of [employer] or Organizations: Yes    Attends Banker Meetings: More than 4 times per year    Marital Status: Married  Catering manager Violence: Not At Risk (08/20/2019)   [facility] visits prior to 03/27/2022., [facility] visits prior to 03/27/2022.   Humiliation, Afraid, Rape, and Kick questionnaire    Fear of Current or Ex-Partner: No    Emotionally Abused: No    Physically Abused: No    Sexually Abused: No     PHYSICAL EXAM  There were no vitals filed for this visit.    There is no height or weight on file to calculate BMI.  No results found.   General: The patient is well-developed and well-nourished and in no acute distress  HEENT:  Head is [place]/AT.  Sclera are anicteric.    Neck: The neck is nontender.  Neck strength was normal.  Good range of motion. Skin: Extremities are without rash or  edema.  Neurologic Exam  Mental status: The patient is alert and oriented x 3 at the time of the examination.  The patient has apparent normal recent and remote memory, with an apparently normal attention span and concentration ability.      speech is normal.  Cranial nerves: Extraocular movements are full.  No ptosis.  Facial strength was normal.  Facial sensation was normal.  Neck strength was normal.  No obvious hearing deficits are noted.  Motor:  Muscle bulk is normal.   Tone is normal. Strength is  5 / 5 in all 4 extremities.   Gait and station: Station is normal.   Gait is normal. Tandem gait is normal. Romberg is negative.       DIAGNOSTIC DATA (LABS, IMAGING, TESTING) - I reviewed patient records, labs, notes, testing and imaging myself where available.  Lab Results  Component Value Date   WBC 8.1 11/03/2010   HGB 15.4 (H) 11/03/2010  HCT 44.2 11/03/2010   MCV 85.5 11/03/2010   PLT 267 11/03/2010      Component Value Date/Time   NA 139 11/03/2010 1730   K 3.5 11/03/2010 1730   CL 104 11/03/2010 1730   CO2 27 11/03/2010 1730   GLUCOSE 109 (H) 11/03/2010 1730   BUN 21 11/03/2010 1730   CREATININE 1.10 11/03/2010 1730   CALCIUM 10.4 11/03/2010 1730   GFRNONAA 55 (L) 11/03/2010 1730   GFRAA 63 (L) 11/03/2010 1730       ASSESSMENT AND PLAN  Chronic migraine w/o aura w/o status migrainosus, not intractable  Inject Botox as follows: Inject 125 units into the muscles of the head and face as follows : Frontalis (6 x 5 units), corrugators (2 x 5 units), nasalis (1 x 5 unit), temporalis (5x 5 units on the right and 6 x 5 units on the left), occipitalis 4 x 5 units).   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    45 units wasted Continue Excedrin +/- sumatriptan prn migraine Continue doxepin and Ambien for insomnia.     RTC 3 months or sooner if new or worsening neurologic issues   Shanee Batch A. Epimenio Foot, MD, [facility] 12/28/2022, 2:43 PM Certified in Neurology, Clinical Neurophysiology, Sleep Medicine and Neuroimaging  [facility] [address], [address] [place], [place] [address] [phone]

## 2022-12-28 NOTE — Progress Notes (Signed)
Botox- 100 units x 2 vials Lot: [id number] Expiration: 04/2025 NDC: 6295-2841-32  Bacteriostatic 0.9% Sodium Chloride- 2 mL  GMW:[id number] Expiration: 04/012025 NDC:0409-1966-02  Dx: G43.709 B/B Witnessed by:Dr. Epimenio Foot independently drew up sodium chloride and botox.

## 2023-02-13 ENCOUNTER — Other Ambulatory Visit: Payer: Self-pay | Admitting: Neurology

## 2023-02-15 ENCOUNTER — Other Ambulatory Visit: Payer: Self-pay

## 2023-02-15 NOTE — Telephone Encounter (Signed)
Pt checking status of this refill. States she was told by the pharmacy to contact us because they were having problems getting it filled. Requesting call back

## 2023-02-15 NOTE — Telephone Encounter (Signed)
Pt Last Seen 12/28/2022 (Dr. Epimenio Foot) Upcoming Appointment (Botox by Dr. Epimenio Foot) 03/30/2023  Zolpidem 10mg  Last Filled 11/16/2022 Escript 02/15/2023

## 2023-02-16 NOTE — Telephone Encounter (Signed)
Called and spoke w/ pt and confirmed she picked up medication.

## 2023-03-03 NOTE — Telephone Encounter (Signed)
 Marthann Slade [facility] PA form to [phone].

## 2023-03-07 NOTE — Telephone Encounter (Addendum)
 Auth# [id number] (03/03/23-08/31/23)

## 2023-03-11 DIAGNOSIS — Z8616 Personal history of COVID-19: Secondary | ICD-10-CM | POA: Insufficient documentation

## 2023-03-11 DIAGNOSIS — R52 Pain, unspecified: Secondary | ICD-10-CM | POA: Insufficient documentation

## 2023-03-30 ENCOUNTER — Ambulatory Visit: Payer: Medicare HMO | Admitting: Neurology

## 2023-03-30 VITALS — BP 120/71 | HR 74

## 2023-03-30 DIAGNOSIS — G4700 Insomnia, unspecified: Secondary | ICD-10-CM | POA: Diagnosis not present

## 2023-03-30 DIAGNOSIS — G43709 Chronic migraine without aura, not intractable, without status migrainosus: Secondary | ICD-10-CM | POA: Diagnosis not present

## 2023-03-30 MED ORDER — ONABOTULINUMTOXINA 200 UNITS IJ SOLR
155.0000 [IU] | Freq: Once | INTRAMUSCULAR | Status: AC
  Administered 2023-03-30: 155 [IU] via INTRAMUSCULAR

## 2023-03-30 NOTE — Progress Notes (Signed)
 [facility]  [facility]  PATIENT: Cindy Haley DOB: 04/21/1955  REFERRING DOCTOR OR PCP:  Tilford Pillar, MD (PCP) SOURCE: patient, notes from PCP  _________________________________   HISTORICAL  CHIEF COMPLAINT:  Chief Complaint  Patient presents with   Injections    Rm10, alone, Pt is well and ready for injection    HISTORY OF PRESENT ILLNESS:  Cindy Haley is a 68 y.o. woman with chronic migraines.  Update 03/30/23: She reports that her chronic migraines have gone well since her last Botox injections 3 months ago.  Specifically, she has had only a few headaches over the last 2 weeks and no major headaches for the first 10 weeks.  Before Botox, she had migraine 30/30 days a month.  She still has headaches that occur almost all mornings but they are fairly mild and resolved relatively quickly.  She has one migraine a week but a few more the last 2-3 weeks of each cycle.   Most HA's are most intense in the left orbital/temporal region.   Often, she wakes up with the headache and it sometimes awakens her.   When the migraine occurs she will sometimes get nausea.  She has photophobia and phonophobia.  Movement will worsen the pain.  Due to some neck weakness in the past we have used a lower amount of Botox in the neck than the typical schedule.  When one occurs she takes Imitrex and Excedrin and lays down.   If she falls asleep, the migraine usually is better when she wakes up.     She has insomnia.  She is doing well with the current combination of zolpidem and doxepin.  She does not have any hangover when she wakes up in the morning.    She falls asleep fairly easily with Ambien.  Without it she has trouble initiating asleep.    Since starting doxepin, she notes that if she wakes up in the middle of the night, she is able to fall back asleep again.    She sometimes experiences  positional vertigo, about the same as earlier this year and not a problem.       HISTORY OF  MIGRAINE She has had migraine headaches since around 2000.  They became daily about 2010/2011 years ago.    A typical headache is above the right eye and has a throbbing quality.   She gets nausea and occasioanl vomiting.   She has photophobia, phonophobia and moving worsens the pain.    She wakes up most mornings with headaches.  She has HA 30/30 days and they are > 4 hours a day.    She has been taking sumatriptan 50 mg and Excedrin po qAM.   That combination helps the HA in the morning and she can usually work during the day.  The HA may return later in the day.    She takes a triptans with benefit when a migraine occurs.      In the past, she tried and failed the following: Anti-Epileptic drugs: Depakote, Keppra and Zonisamide.   Currently on topiramate and gabapentin without much benefit Tricyclics/antidepressants: Amitriptyline, Duloxetine, Bupropion NSAIDs including piroxicam, Ibuprofen Steroid shots/pills Triptans: Imitrex (short benefit only) Anti-CGRP agents:  Emgality only helped slightly the first month and not with subsequent months.   Opiates (hydrocodone (short benefit only).  Muscle relaxant: Baclofen Allergy and sinus med's: Claritin, Allegra, fluticasone spray Neurotoxins: Botox has been helpful for the chronic migraines     CT 12/02/2017 report read 'negative intracranial  imaging'  REVIEW OF SYSTEMS: Constitutional: No fevers, chills, sweats, or change in appetite.  She has insomnia. Eyes: No visual changes, double vision, eye pain Ear, nose and throat: No hearing loss, ear pain, nasal congestion, sore throat Cardiovascular: No chest pain, palpitations Respiratory:  No shortness of breath at rest or with exertion.   No wheezes GastrointestinaI: No nausea, vomiting, diarrhea, abdominal pain, fecal incontinence Genitourinary:  No dysuria, urinary retention or frequency.  No nocturia. Musculoskeletal:  No neck pain, back pain Integumentary: No rash, pruritus, skin  lesions Neurological: as above Psychiatric: No depression at this time.  No anxiety Endocrine: No palpitations, diaphoresis, change in appetite, change in weigh or increased thirst Hematologic/Lymphatic:  No anemia, purpura, petechiae. Allergic/Immunologic: No itchy/runny eyes, nasal congestion, recent allergic reactions, rashes  ALLERGIES: No Known Allergies  HOME MEDICATIONS:  Current Outpatient Medications:    Aspirin-Acetaminophen-Caffeine (EXCEDRIN PO), Take 2 tablets by mouth every morning. For pain, Disp: , Rfl:    doxepin (SINEQUAN) 10 MG capsule, Take 1 capsule (10 mg total) by mouth at bedtime., Disp: 90 capsule, Rfl: 3   gabapentin (NEURONTIN) 400 MG capsule, Take 800 mg by mouth at bedtime., Disp: , Rfl:    levothyroxine (SYNTHROID, LEVOTHROID) 100 MCG tablet, Take 100 mcg by mouth daily., Disp: , Rfl:    SUMAtriptan (IMITREX) 100 MG tablet, TAKES 1/2 TO ONE TABLET AS NEEDED FOR MIGRAINE. NO MORE THAN 3 TIMES A WEEK, Disp: 15 tablet, Rfl: 1   topiramate (TOPAMAX) 100 MG tablet, Take 2 tablets (200 mg total) by mouth at bedtime., Disp: 60 tablet, Rfl: 3   zolpidem (AMBIEN) 10 MG tablet, TAKE 1 TABLET BY MOUTH EVERYDAY AT BEDTIME, Disp: 90 tablet, Rfl: 1  Current Facility-Administered Medications:    botulinum toxin Type A (BOTOX) injection 155 Units, 155 Units, Intramuscular, Once, Lathyn Griggs, Pearletha Furl, MD  PAST MEDICAL HISTORY: Past Medical History:  Diagnosis Date   Migraine    Thyroid disease     PAST SURGICAL HISTORY: Past Surgical History:  Procedure Laterality Date   BREAST LUMPECTOMY     90's   BREAST REDUCTION SURGERY     90's   CESAREAN SECTION     1976, 1981   PARTIAL HYSTERECTOMY  1985   RECTAL SURGERY      FAMILY HISTORY: Family History  Problem Relation Age of Onset   Anuerysm Father    Kidney disease Brother    Alcohol abuse Brother     SOCIAL HISTORY:  Social History   Socioeconomic History   Marital status: Married    Spouse name: Renae Fickle    Number of children: 2   Years of education: Master's   Highest education level: Not on file  Occupational History   Occupation: Marine scientist  Tobacco Use   Smoking status: Former   Smokeless tobacco: Never  Substance and Sexual Activity   Alcohol use: No   Drug use: Never   Sexual activity: Not on file  Other Topics Concern   Not on file  Social History Narrative   2 cups coffee per day   Lives with spouse   Right handed   Social Drivers of Health   Financial Resource Strain: Low Risk  (08/20/2019)   Received from [facility] visits prior to 03/27/2022., [facility] visits prior to 03/27/2022.   Overall Financial Resource Strain (CARDIA)    Difficulty of Paying Living Expenses: Not hard at all  Food Insecurity: Low Risk  (03/11/2023)  [facility]   Hunger Vital Sign    Worried About Running Out of Food in the Last Year: Never true    Ran Out of Food in the Last Year: Never true  Transportation Needs: No Transportation Needs (03/11/2023)   Received from [facility]    In the past 12 months, has lack of reliable transportation kept you from medical appointments, meetings, work or from getting things needed for daily living? : No  Physical Activity: Unknown (08/20/2019)   [facility] visits prior to 03/27/2022., [facility] visits prior to 03/27/2022.   Exercise Vital Sign    Days of Exercise per Week: 2 days    Minutes of Exercise per Session: Not on file  Stress: No Stress Concern Present (08/20/2019)   [facility] prior to 03/27/2022., [facility] visits prior to 03/27/2022.   [employer] of Occupational Health - Occupational Stress Questionnaire    Feeling of Stress : Only a little  Social Connections: Socially Integrated (08/20/2019)   Received from [facility] visits prior to 03/27/2022., [facility] visits prior to 03/27/2022.   Social Advertising account executive [NHANES]    Frequency of Communication with Friends and Family: More than three times a week    Frequency of Social Gatherings with Friends and Family: Twice a week    Attends Religious Services: More than 4 times per year    Active Member of [employer] or Organizations: Yes    Attends Banker Meetings: More than 4 times per year    Marital Status: Married  Catering manager Violence: Not At Risk (08/20/2019)   [facility] visits prior to 03/27/2022., [facility] visits prior to 03/27/2022.   Humiliation, Afraid, Rape, and Kick questionnaire    Fear of Current or Ex-Partner: No    Emotionally Abused: No    Physically Abused: No    Sexually Abused: No     PHYSICAL EXAM  Vitals:   03/30/23 1333  BP: 120/71  Pulse: 74      There is no height or weight on file to calculate BMI.  No results found.   General: The patient is well-developed and well-nourished and in no acute distress  HEENT:  Head is [place]/AT.  Sclera are anicteric.    Neck: The neck is nontender.  Neck strength was normal.  Good range of motion. Skin: Extremities are without rash or  edema.  Neurologic Exam  Mental status: The patient is alert and oriented x 3 at the time of the examination.  The patient has apparent normal recent and remote memory, with an apparently normal attention span and concentration ability.      speech is normal.  Cranial nerves: Extraocular movements are full. There is no ptosis.  Facial strength was normal.  Facial sensation was normal.  Neck strength was normal.  No obvious hearing deficits are noted.  Motor:  Muscle bulk is normal.   Tone is normal. Strength is  5 / 5 in all 4 extremities.   Gait and station: Station is normal.   Gait is normal. Tandem gait is normal. Romberg is  negative.      DIAGNOSTIC DATA (LABS, IMAGING, TESTING) - I reviewed patient records, labs, notes, testing and imaging myself where available.  Lab Results  Component Value  Date   WBC 8.1 11/03/2010   HGB 15.4 (H) 11/03/2010   HCT 44.2 11/03/2010   MCV 85.5 11/03/2010   PLT 267 11/03/2010      Component Value Date/Time   NA 139 11/03/2010 1730   K 3.5 11/03/2010 1730   CL 104 11/03/2010 1730   CO2 27 11/03/2010 1730   GLUCOSE 109 (H) 11/03/2010 1730   BUN 21 11/03/2010 1730   CREATININE 1.10 11/03/2010 1730   CALCIUM 10.4 11/03/2010 1730   GFRNONAA 55 (L) 11/03/2010 1730   GFRAA 63 (L) 11/03/2010 1730       ASSESSMENT AND PLAN  Chronic migraine w/o aura w/o status migrainosus, not intractable - Plan: botulinum toxin Type A (BOTOX) injection 155 Units  Insomnia, unspecified type  Inject Botox as follows: Inject 125 units into the muscles of the head and face as follows : Frontalis (6 x 5 units), corrugators (2 x 5 units), nasalis (1 x 5 unit), temporalis (5x 5 units on the right and 6 x 5 units on the left), occipitalis 4 x 5 units).   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    45 units wasted Continue Excedrin +/- sumatriptan prn migraine Insomnia is doing well with doxepin and Ambien.     RTC 3 months or sooner if new or worsening neurologic issues   Jessa Stinson A. Epimenio Foot, MD, [facility] 03/30/2023, 1:57 PM Certified in Neurology, Clinical Neurophysiology, Sleep Medicine and Neuroimaging  [facility] [address] [place], [place] [address] [phone]

## 2023-03-30 NOTE — Progress Notes (Signed)
 Botox- 200 units x 1 vial Lot: [id number] Expiration: 2027/04 NDC: 6213-0865-78  Bacteriostatic 0.9% Sodium Chloride- 4 mL  Lot: [id number] Expiration: 11/26/23 NDC: 5284132440  Dx:  N02.725   B/B Witnessed by DR. SATER DRAWS OWN

## 2023-04-20 DIAGNOSIS — R112 Nausea with vomiting, unspecified: Secondary | ICD-10-CM | POA: Insufficient documentation

## 2023-04-20 DIAGNOSIS — R197 Diarrhea, unspecified: Secondary | ICD-10-CM | POA: Insufficient documentation

## 2023-05-19 ENCOUNTER — Telehealth: Payer: Self-pay | Admitting: *Deleted

## 2023-05-19 NOTE — Telephone Encounter (Signed)
 Faxed completed/signed PA form below to [facility]. Received fax confirmation, waiting on determination.

## 2023-05-19 NOTE — Telephone Encounter (Signed)
 Called [facility] Medicare Part D at [phone] because we received denial stating clinical info not received.  We received fax yesterday after 3pm and faxed back this am.  Spoke w/ Dennison Fix. She placed me on hold to speak with clinical pharmacist, Eda. I provided further info. Request approved, approval letter will be faxed to our office and copy will be sent to pt as well. Approved effective 01/26/23-01/25/24.   Total time of call: 16 min

## 2023-05-23 NOTE — Telephone Encounter (Signed)
 Cindy Haley

## 2023-06-28 ENCOUNTER — Ambulatory Visit: Admitting: Neurology

## 2023-07-07 ENCOUNTER — Encounter: Payer: Self-pay | Admitting: Neurology

## 2023-07-07 ENCOUNTER — Ambulatory Visit: Admitting: Neurology

## 2023-07-07 ENCOUNTER — Other Ambulatory Visit (INDEPENDENT_AMBULATORY_CARE_PROVIDER_SITE_OTHER): Payer: Self-pay | Admitting: Neurology

## 2023-07-07 VITALS — BP 115/82 | HR 71

## 2023-07-07 DIAGNOSIS — H8111 Benign paroxysmal vertigo, right ear: Secondary | ICD-10-CM

## 2023-07-07 DIAGNOSIS — G43709 Chronic migraine without aura, not intractable, without status migrainosus: Secondary | ICD-10-CM

## 2023-07-07 DIAGNOSIS — G4700 Insomnia, unspecified: Secondary | ICD-10-CM

## 2023-07-07 MED ORDER — ONABOTULINUMTOXINA 200 UNITS IJ SOLR
155.0000 [IU] | Freq: Once | INTRAMUSCULAR | Status: AC
  Administered 2023-07-07: 155 [IU] via INTRAMUSCULAR

## 2023-07-07 MED ORDER — ONABOTULINUMTOXINA 200 UNITS IJ SOLR: 155.0000 [IU] | Freq: Once | INTRAMUSCULAR | Status: DC

## 2023-07-07 NOTE — Progress Notes (Signed)
 Botox - 200 units x 1 vial Lot: [id number] Expiration: 10/2025 NDC: 5784-6962-95  Bacteriostatic 0.9% Sodium Chloride - 4 mL  Lot: [id number] Expiration: 07/24/2025 NDC: 4401-0272-53  Dx: G64.403 B/B Witnessed by; DR. Joeseph Muss OWN

## 2023-07-07 NOTE — Progress Notes (Signed)
 4  [facility]  PATIENT: Cindy Haley DOB: [birth year]  REFERRING DOCTOR OR PCP:  Kipp People, MD (PCP) SOURCE: patient, notes from PCP  _________________________________   HISTORICAL  CHIEF COMPLAINT:  Chief Complaint  Patient presents with   Follow-up    Pt in room 11. Alone.Here for botox  injection for migraines.    HISTORY OF PRESENT ILLNESS:  Cindy Haley is a 68 y.o. woman with chronic migraines.  Update 6/12/205: She reports that her chronic migraines have gone well since her last Botox  injections 3 months ago. She will have a mild HA some mornings that improves with a 1/4 Imitrex  and Excedrin.  Only a couple more severe migraines, more the last coule weeks of each cycle.    Before Botox , she had migraine 30/30 days a month.   Most HA's are most intense in the left orbital/temporal region.   Often, she wakes up with the headache and it sometimes awakens her.   When the migraine occurs she will sometimes get nausea.  She has photophobia and phonophobia.  Movement will worsen the pain.  Due to some neck weakness in the past we have used a lower amount of Botox  in the neck than the typical schedule.  When one occurs she takes Imitrex  and Excedrin and lays down.   If she falls asleep, the migraine usually is better when she wakes up.     Insomnia is doing well with the current combination of zolpidem  and doxepin .  She does not have any hangover when she wakes up in the morning.    She falls asleep fairly easily with Ambien .  If she wakes up she usually falls back asleep quickly.   Since starting doxepin , she notes that if she wakes up in the middle of the night, she is able to fall back asleep again.     HISTORY OF MIGRAINE She has had migraine headaches since around 2000.  They became daily about 2010/2011 years ago.    A typical headache is above the right eye and has a throbbing quality.   She gets nausea and occasioanl vomiting.   She has  photophobia, phonophobia and moving worsens the pain.    She wakes up most mornings with headaches.  She has HA 30/30 days and they are > 4 hours a day.    She has been taking sumatriptan  50 mg and Excedrin po qAM.   That combination helps the HA in the morning and she can usually work during the day.  The HA may return later in the day.    She takes a triptans with benefit when a migraine occurs.      In the past, she tried and failed the following: Anti-Epileptic drugs: Depakote, Keppra and Zonisamide.   Currently on topiramate  and gabapentin without much benefit Tricyclics/antidepressants: Amitriptyline, Duloxetine, Bupropion NSAIDs including piroxicam, Ibuprofen Steroid shots/pills Triptans: Imitrex  (short benefit only) Anti-CGRP agents:  Emgality  only helped slightly the first month and not with subsequent months.   Opiates (hydrocodone  (short benefit only).  Muscle relaxant: Baclofen Allergy and sinus med's: Claritin, Allegra, fluticasone spray Neurotoxins: Botox  has been helpful for the chronic migraines     CT 12/02/2017 report read 'negative intracranial imaging'  REVIEW OF SYSTEMS: Constitutional: No fevers, chills, sweats, or change in appetite.  She has insomnia. Eyes: No visual changes, double vision, eye pain Ear, nose and throat: No hearing loss, ear pain, nasal congestion, sore throat Cardiovascular: No chest pain, palpitations Respiratory:  No shortness of breath  at rest or with exertion.   No wheezes GastrointestinaI: No nausea, vomiting, diarrhea, abdominal pain, fecal incontinence Genitourinary:  No dysuria, urinary retention or frequency.  No nocturia. Musculoskeletal:  No neck pain, back pain Integumentary: No rash, pruritus, skin lesions Neurological: as above Psychiatric: No depression at this time.  No anxiety Endocrine: No palpitations, diaphoresis, change in appetite, change in weigh or increased thirst Hematologic/Lymphatic:  No anemia, purpura,  petechiae. Allergic/Immunologic: No itchy/runny eyes, nasal congestion, recent allergic reactions, rashes  ALLERGIES: No Known Allergies  HOME MEDICATIONS:  Current Outpatient Medications:    Aspirin-Acetaminophen -Caffeine (EXCEDRIN PO), Take 2 tablets by mouth every morning. For pain, Disp: , Rfl:    doxepin  (SINEQUAN ) 10 MG capsule, Take 1 capsule (10 mg total) by mouth at bedtime., Disp: 90 capsule, Rfl: 3   gabapentin (NEURONTIN) 400 MG capsule, Take 800 mg by mouth at bedtime., Disp: , Rfl:    levothyroxine (SYNTHROID, LEVOTHROID) 100 MCG tablet, Take 100 mcg by mouth daily., Disp: , Rfl:    SUMAtriptan  (IMITREX ) 100 MG tablet, TAKES 1/2 TO ONE TABLET AS NEEDED FOR MIGRAINE. NO MORE THAN 3 TIMES A WEEK, Disp: 15 tablet, Rfl: 1   topiramate  (TOPAMAX ) 100 MG tablet, Take 2 tablets (200 mg total) by mouth at bedtime., Disp: 60 tablet, Rfl: 3   zolpidem  (AMBIEN ) 10 MG tablet, TAKE 1 TABLET BY MOUTH EVERYDAY AT BEDTIME, Disp: 90 tablet, Rfl: 1  PAST MEDICAL HISTORY: Past Medical History:  Diagnosis Date   Migraine    Thyroid disease     PAST SURGICAL HISTORY: Past Surgical History:  Procedure Laterality Date   BREAST LUMPECTOMY     90's   BREAST REDUCTION SURGERY     90's   CESAREAN SECTION     1976, 1981   PARTIAL HYSTERECTOMY  1985   RECTAL SURGERY      FAMILY HISTORY: Family History  Problem Relation Age of Onset   Anuerysm Father    Kidney disease Brother    Alcohol abuse Brother     SOCIAL HISTORY:  Social History   Socioeconomic History   Marital status: Married    Spouse name: Donavon Fudge   Number of children: 2   Years of education: Master's   Highest education level: Not on file  Occupational History   Occupation: Marine scientist  Tobacco Use   Smoking status: Former   Smokeless tobacco: Never  Substance and Sexual Activity   Alcohol use: No   Drug use: Never   Sexual activity: Not on file  Other Topics Concern   Not on file  Social History  Narrative   2 cups coffee per day   Lives with spouse   Right handed   Social Drivers of Health   Financial Resource Strain: Low Risk  (08/20/2019)   Received from [facility] visits prior to 03/27/2022.   Overall Financial Resource Strain (CARDIA)    Difficulty of Paying Living Expenses: Not hard at all  Food Insecurity: Low Risk  (03/11/2023)   [facility] from [facility]   Hunger Vital Sign    Within the past 12 months, you worried that your food would run out before you got money to buy more: Never true    Within the past 12 months, the food you bought just didn't last and you didn't have money to get more. : Never true  Transportation Needs: No Transportation Needs (03/11/2023)   Received from [facility]    In the  past 12 months, has lack of reliable transportation kept you from medical appointments, meetings, work or from getting things needed for daily living? : No  Physical Activity: Unknown (08/20/2019)   Received from [facility] visits prior to 03/27/2022.   Exercise Vital Sign    Days of Exercise per Week: 2 days    Minutes of Exercise per Session: Not on file  Stress: No Stress Concern Present (08/20/2019)   Received from [facility] visits prior to 03/27/2022.   [employer] of Occupational Health - Occupational Stress Questionnaire    Feeling of Stress : Only a little  Social Connections: Socially Integrated (08/20/2019)   Received from [facility] visits prior to 03/27/2022.   Social Connection and Isolation Panel    Frequency of Communication with Friends and Family: More than three times a week    Frequency of Social Gatherings with Friends and Family: Twice a week    Attends Religious Services: More than 4 times per year    Active Member of [employer] or Organizations: Yes    Attends Banker Meetings: More than 4 times per year    Marital Status: Married   Catering manager Violence: Not At Risk (08/20/2019)   Received from [facility] visits prior to 03/27/2022.   Humiliation, Afraid, Rape, and Kick questionnaire    Fear of Current or Ex-Partner: No    Emotionally Abused: No    Physically Abused: No    Sexually Abused: No     PHYSICAL EXAM  Vitals:   07/07/23 1326  BP: 115/82  Pulse: 71  SpO2: 99%      There is no height or weight on file to calculate BMI.  No results found.   General: The patient is well-developed and well-nourished and in no acute distress  HEENT:  Head is [place]/AT.  Sclera are anicteric.    Neck: The neck is nontender.  Neck strength was normal.  Good range of motion. Skin: Extremities are without rash or  edema.  Neurologic Exam  Mental status: The patient is alert and oriented x 3 at the time of the examination.  The patient has apparent normal recent and remote memory, with an apparently normal attention span and concentration ability.      speech is normal.  Cranial nerves: Extraocular movements are full. There is no ptosis.  Facial strength was normal.  Facial sensation was normal.  Neck strength was normal.  No obvious hearing deficits are noted.  Motor:  Muscle bulk is normal.   Tone is normal. Strength is  5 / 5 in all 4 extremities.   Gait and station: Station is normal.   Gait is normal. Tandem gait is normal. Romberg is negative.      DIAGNOSTIC DATA (LABS, IMAGING, TESTING) - I reviewed patient records, labs, notes, testing and imaging myself where available.  Lab Results  Component Value Date   WBC 8.1 11/03/2010   HGB 15.4 (H) 11/03/2010   HCT 44.2 11/03/2010   MCV 85.5 11/03/2010   PLT 267 11/03/2010      Component Value Date/Time   NA 139 11/03/2010 1730   K 3.5 11/03/2010 1730   CL 104 11/03/2010 1730   CO2 27 11/03/2010 1730   GLUCOSE 109 (H) 11/03/2010 1730   BUN 21 11/03/2010 1730   CREATININE 1.10 11/03/2010 1730   CALCIUM 10.4 11/03/2010 1730    GFRNONAA 55 (L) 11/03/2010 1730  GFRAA 63 (L) 11/03/2010 1730       ASSESSMENT AND PLAN  Chronic migraine w/o aura w/o status migrainosus, not intractable  Insomnia, unspecified type  Benign paroxysmal positional vertigo of right ear  Inject Botox  as follows: Inject 125 units into the muscles of the head and face as follows : Frontalis (6 x 5 units), corrugators (2 x 5 units), nasalis (1 x 5 unit), temporalis (5x 5 units on the right and 6 x 5 units on the left), occipitalis 4 x 5 units).   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    45 units wasted Continue Excedrin +/- sumatriptan  prn migraine Insomnia is doing well with doxepin  and Ambien .     RTC 3 months or sooner if new or worsening neurologic issues   Botox - 200 units x 1 vial Lot: [id number] Expiration: 10/2025 NDC: 4540-9811-91       Eutha Cude A. Godwin Lat, MD, [facility] 07/07/2023, 4:40 PM Certified in Neurology, Clinical Neurophysiology, Sleep Medicine and Neuroimaging  [facility] [address] [place], [place] [address] [phone]

## 2023-08-09 ENCOUNTER — Other Ambulatory Visit: Payer: Self-pay | Admitting: Neurology

## 2023-08-10 NOTE — Telephone Encounter (Signed)
 Last seen for botox  07/07/23 Follow up scheduled on 10/04/23   Dispensed Days Supply Quantity Provider Pharmacy  ZOLPIDEM  TARTRATE 10 MG TABLET 05/15/2023 90 90 each Vear Charlie LABOR, MD [facility] [phone] - A...     Rx pending to be signed

## 2023-08-31 ENCOUNTER — Telehealth: Payer: Self-pay | Admitting: Neurology

## 2023-08-31 DIAGNOSIS — G43709 Chronic migraine without aura, not intractable, without status migrainosus: Secondary | ICD-10-CM

## 2023-08-31 NOTE — Telephone Encounter (Signed)
 Botox  PA expired 08/31/23. Completed [facility] PA form and faxed with notes to [phone].

## 2023-08-31 NOTE — Telephone Encounter (Signed)
 Auth# [id number] (09/01/23-08/31/24)

## 2023-09-01 DIAGNOSIS — Z79899 Other long term (current) drug therapy: Secondary | ICD-10-CM | POA: Insufficient documentation

## 2023-10-01 ENCOUNTER — Other Ambulatory Visit: Payer: Self-pay | Admitting: Neurology

## 2023-10-03 NOTE — Telephone Encounter (Signed)
 Last seen on 07/07/23 Follow up scheduled on 10/04/23 for botox 

## 2023-10-04 ENCOUNTER — Ambulatory Visit: Admitting: Neurology

## 2023-10-04 ENCOUNTER — Encounter: Payer: Self-pay | Admitting: Neurology

## 2023-10-04 ENCOUNTER — Telehealth: Payer: Self-pay | Admitting: Neurology

## 2023-10-04 NOTE — Telephone Encounter (Signed)
 Patient rescheduled appointment due to having fever, cough, stuffy nose, runny nose, sore throat.

## 2023-10-10 MED ORDER — ONABOTULINUMTOXINA 200 UNITS IJ SOLR: INTRAMUSCULAR | 2 refills | Status: AC

## 2023-10-10 NOTE — Addendum Note (Signed)
 Addended by: JOSHUA MAURILIO CROME on: 10/10/2023 01:19 PM   Modules accepted: Orders

## 2023-10-10 NOTE — Telephone Encounter (Signed)
 E-scribed rx to [facility]

## 2023-10-10 NOTE — Telephone Encounter (Signed)
 Completed auth request to transition pt to [facility], auth was approved. Please send rx to [facility], thank you!  Auth#: [id number] (01/26/23-01/25/24)

## 2023-10-12 ENCOUNTER — Other Ambulatory Visit: Payer: Self-pay | Admitting: Neurology

## 2023-10-12 NOTE — Telephone Encounter (Signed)
 Last seen on 07/07/23 Last seen on 11/01/23

## 2023-10-27 ENCOUNTER — Telehealth: Payer: Self-pay | Admitting: Neurology

## 2023-10-27 NOTE — Telephone Encounter (Signed)
 Noted

## 2023-10-27 NOTE — Telephone Encounter (Signed)
 Phone room: please call pt and let her know that since Botox  unaffordable now, will need to come in for a visit to see Dr. Vear to discuss other options.   You can offer 11/14/23 at 930am (appt on hold)

## 2023-10-27 NOTE — Telephone Encounter (Signed)
 Cindy Haley- will Botox  program be able to help with high copay cost?

## 2023-10-27 NOTE — Telephone Encounter (Signed)
 Patient said pharmacy informed that cost for Botox  464.71 copay. Called [place] said that is the copay for prescription and this a medical procedure. Normally pays $255.60 for medical procedure.  Would like a call from the Botox  Coordinator, Jillian.

## 2023-10-27 NOTE — Telephone Encounter (Signed)
 Unfortunately, our office is switching everyone to [facility]. She may need to discuss alternative treatment due to the cost increase.

## 2023-11-01 ENCOUNTER — Ambulatory Visit: Admitting: Neurology

## 2023-11-01 ENCOUNTER — Other Ambulatory Visit: Payer: Self-pay | Admitting: Medical Genetics

## 2023-11-01 NOTE — Telephone Encounter (Signed)
 FYI to pod- We received 200u of Botox  today from [facility]. I called the patient because per previous messages, she stated she could not afford to fill through [facility]. She states she told [facility] to send the Botox  but thought it was cancelled when she called us  to change her appointment to an OV. I told her we were not aware she had authorized the shipment since she told us  she could not afford it and cancelled her shipment. Pt became upset and started crying over the phone to me telling me she couldn't afford it. I told her [facility] does not accept returns. Talked with Angie in billing, she states there is nothing we can do aside from have the pt come in to get injected since she authorized the shipment from [facility].

## 2023-11-01 NOTE — Telephone Encounter (Signed)
Dr. Sater- how do you want to proceed? 

## 2023-11-01 NOTE — Telephone Encounter (Signed)
 Pt also left me a VM. I called her back and she agreed to have Botox  injected on 10/20, but would still like to discuss alternative treatment for the future. Changed appt from OV to Botox .

## 2023-11-06 ENCOUNTER — Other Ambulatory Visit: Payer: Self-pay | Admitting: Neurology

## 2023-11-08 NOTE — Telephone Encounter (Signed)
 Last seen on 07/07/23 Follow up scheduled on 11/14/23

## 2023-11-14 ENCOUNTER — Encounter: Payer: Self-pay | Admitting: Neurology

## 2023-11-14 ENCOUNTER — Ambulatory Visit: Admitting: Neurology

## 2023-11-14 VITALS — BP 114/82 | HR 82

## 2023-11-14 DIAGNOSIS — G4700 Insomnia, unspecified: Secondary | ICD-10-CM

## 2023-11-14 DIAGNOSIS — G43709 Chronic migraine without aura, not intractable, without status migrainosus: Secondary | ICD-10-CM | POA: Diagnosis not present

## 2023-11-14 DIAGNOSIS — R269 Unspecified abnormalities of gait and mobility: Secondary | ICD-10-CM

## 2023-11-14 DIAGNOSIS — M542 Cervicalgia: Secondary | ICD-10-CM

## 2023-11-14 DIAGNOSIS — H532 Diplopia: Secondary | ICD-10-CM

## 2023-11-14 MED ORDER — ONABOTULINUMTOXINA 200 UNITS IJ SOLR
155.0000 [IU] | Freq: Once | INTRAMUSCULAR | Status: AC
  Administered 2023-11-14: 155 [IU] via INTRAMUSCULAR

## 2023-11-14 MED ORDER — SUMATRIPTAN SUCCINATE 100 MG PO TABS: ORAL_TABLET | ORAL | 11 refills | Status: AC

## 2023-11-14 MED ORDER — DOXEPIN HCL 10 MG PO CAPS: 10.0000 mg | ORAL_CAPSULE | Freq: Every day | ORAL | 3 refills | Status: AC

## 2023-11-14 NOTE — Progress Notes (Signed)
 Botox - 200 units x 1 vial Lot: [id number] Expiration: 03/2026 NDC: 9976-6078-97  Bacteriostatic 0.9% Sodium Chloride - 30 mL  Lot: [id number] Expiration: OCT-31-2026 NDC: 9590-8033-97  Dx:G43.709 S/P Witnessed by: Dr.Sater draws own

## 2023-11-14 NOTE — Progress Notes (Signed)
 4  [facility]  PATIENT: Cindy Haley DOB: September 06, 1955  REFERRING DOCTOR OR PCP:  Darnelle Lefevre, MD (PCP) SOURCE: patient, notes from PCP  _________________________________   HISTORICAL  CHIEF COMPLAINT:  Chief Complaint  Patient presents with   Follow-up    Pt in room 10. Alone. Here for botox  injection for migraines.    HISTORY OF PRESENT ILLNESS:  Cindy Haley is a 68 y.o. woman with chronic migraines.  Update 11/14/2023: She reports that her chronic migraines have gone well after her last Botox  injections 3 months ago. However, the last month has had more HA. She will have a mild HA some mornings and takes 1/2 Imitrex  and Excedrin.  Before Botox , she had migraine 30/30 days a month.   Most HA's are most intense in the left orbital/temporal region.   Often, she wakes up with the headache and it sometimes awakens her.   When the migraine occurs she will sometimes get nausea.  She has photophobia and phonophobia.  Movement will worsen the pain.  Due to some neck weakness in the past we have used a lower amount of Botox  in the neck than the typical schedule.  When one occurs she takes Imitrex  and Excedrin and lays down.  Migraines always improve if she falls asleep .   Insomnia is doing well with the current combination of zolpidem  and doxepin .  She does not have any hangover when she wakes up in the morning.    She falls asleep fairly easily with Ambien .  If she wakes up she usually falls back asleep quickly.   Since starting doxepin , she notes that if she wakes up in the middle of the night, she is able to fall back asleep again.     HISTORY OF MIGRAINE She has had migraine headaches since around 2000.  They became daily about 2010/2011 years ago.    A typical headache is above the right eye and has a throbbing quality.   She gets nausea and occasioanl vomiting.   She has photophobia, phonophobia and moving worsens the pain.    She wakes up most mornings  with headaches.  She has HA 30/30 days and they are > 4 hours a day.    She has been taking sumatriptan  50 mg and Excedrin po qAM.   That combination helps the HA in the morning and she can usually work during the day.  The HA may return later in the day.    She takes a triptans with benefit when a migraine occurs.      In the past, she tried and failed the following: Anti-Epileptic drugs: Depakote, Keppra and Zonisamide.   Currently on topiramate  and gabapentin without much benefit Tricyclics/antidepressants: Amitriptyline, Duloxetine, Bupropion NSAIDs including piroxicam, Ibuprofen Steroid shots/pills Triptans: Imitrex  (short benefit only) Anti-CGRP agents:  Emgality  only helped slightly the first month and not with subsequent months.   Opiates (hydrocodone  (short benefit only).  Muscle relaxant: Baclofen Allergy and sinus med's: Claritin, Allegra, fluticasone spray Neurotoxins: Botox  has been helpful for the chronic migraines     CT 12/02/2017 report read 'negative intracranial imaging'  REVIEW OF SYSTEMS: Constitutional: No fevers, chills, sweats, or change in appetite.  She has insomnia. Eyes: No visual changes, double vision, eye pain Ear, nose and throat: No hearing loss, ear pain, nasal congestion, sore throat Cardiovascular: No chest pain, palpitations Respiratory:  No shortness of breath at rest or with exertion.   No wheezes GastrointestinaI: No nausea, vomiting, diarrhea, abdominal pain, fecal incontinence  Genitourinary:  No dysuria, urinary retention or frequency.  No nocturia. Musculoskeletal:  No neck pain, back pain Integumentary: No rash, pruritus, skin lesions Neurological: as above Psychiatric: No depression at this time.  No anxiety Endocrine: No palpitations, diaphoresis, change in appetite, change in weigh or increased thirst Hematologic/Lymphatic:  No anemia, purpura, petechiae. Allergic/Immunologic: No itchy/runny eyes, nasal congestion, recent allergic  reactions, rashes  ALLERGIES: No Known Allergies  HOME MEDICATIONS:  Current Outpatient Medications:    Aspirin-Acetaminophen -Caffeine (EXCEDRIN PO), Take 2 tablets by mouth every morning. For pain, Disp: , Rfl:    botulinum toxin Type A  (BOTOX ) 200 units injection, Provider to inject 155 units into the muscles of the head and neck every 3 months. Discard remainder, Disp: 1 each, Rfl: 2   gabapentin (NEURONTIN) 400 MG capsule, Take 800 mg by mouth at bedtime., Disp: , Rfl:    levothyroxine (SYNTHROID, LEVOTHROID) 100 MCG tablet, Take 100 mcg by mouth daily., Disp: , Rfl:    topiramate  (TOPAMAX ) 100 MG tablet, Take 2 tablets (200 mg total) by mouth at bedtime., Disp: 60 tablet, Rfl: 3   zolpidem  (AMBIEN ) 10 MG tablet, TAKE 1 TABLET BY MOUTH EVERYDAY AT BEDTIME, Disp: 90 tablet, Rfl: 1   doxepin  (SINEQUAN ) 10 MG capsule, Take 1 capsule (10 mg total) by mouth at bedtime., Disp: 90 capsule, Rfl: 3   SUMAtriptan  (IMITREX ) 100 MG tablet, TAKES 1/2 TO ONE TABLET AS NEEDED FOR MIGRAINE. NO MORE THAN 3 TIMES A WEEK, Disp: 15 tablet, Rfl: 11  PAST MEDICAL HISTORY: Past Medical History:  Diagnosis Date   Migraine    Thyroid disease     PAST SURGICAL HISTORY: Past Surgical History:  Procedure Laterality Date   BREAST LUMPECTOMY     90's   BREAST REDUCTION SURGERY     90's   CESAREAN SECTION     1976, 1981   PARTIAL HYSTERECTOMY  1985   RECTAL SURGERY      FAMILY HISTORY: Family History  Problem Relation Age of Onset   Anuerysm Father    Kidney disease Brother    Alcohol abuse Brother     SOCIAL HISTORY:  Social History   Socioeconomic History   Marital status: Married    Spouse name: Deward   Number of children: 2   Years of education: Master's   Highest education level: Not on file  Occupational History   Occupation: Marine scientist  Tobacco Use   Smoking status: Former   Smokeless tobacco: Never  Substance and Sexual Activity   Alcohol use: No   Drug use:  Never   Sexual activity: Not on file  Other Topics Concern   Not on file  Social History Narrative   2 cups coffee per day   Lives with spouse   Right handed   Social Drivers of Health   Financial Resource Strain: Low Risk  (08/20/2019)   Received from [facility] visits prior to 03/27/2022.   Overall Financial Resource Strain (CARDIA)    Difficulty of Paying Living Expenses: Not hard at all  Food Insecurity: Low Risk  (08/26/2023)   Received from [facility]   Hunger Vital Sign    Within the past 12 months, you worried that your food would run out before you got money to buy more: Never true    Within the past 12 months, the food you bought just didn't last and you didn't have money to get more. : Never true  Transportation Needs: No Transportation Needs (  08/26/2023)   Received from [facility]    In the past 12 months, has lack of reliable transportation kept you from medical appointments, meetings, work or from getting things needed for daily living? : No  Physical Activity: Unknown (08/20/2019)   Received from [facility] visits prior to 03/27/2022.   Exercise Vital Sign    On average, how many days per week do you engage in moderate to strenuous exercise (like a brisk walk)?: 2 days    Minutes of Exercise per Session: Not on file  Stress: No Stress Concern Present (08/20/2019)   Received from [facility] visits prior to 03/27/2022.   [employer] of Occupational Health - Occupational Stress Questionnaire    Feeling of Stress : Only a little  Social Connections: Socially Integrated (08/20/2019)   Received from [facility] visits prior to 03/27/2022.   Social Connection and Isolation Panel    In a typical week, how many times do you talk on the phone with family, friends, or neighbors?: More than three times a week    How often do you get together with friends or relatives?: Twice  a week    How often do you attend church or religious services?: More than 4 times per year    Do you belong to any clubs or organizations such as church groups, unions, fraternal or athletic groups, or school groups?: Yes    How often do you attend meetings of the clubs or organizations you belong to?: More than 4 times per year    Are you married, widowed, divorced, separated, never married, or living with a partner?: Married  Intimate Partner Violence: Not At Risk (08/20/2019)   Received from [facility] visits prior to 03/27/2022.   Humiliation, Afraid, Rape, and Kick questionnaire    Within the last year, have you been afraid of your partner or ex-partner?: No    Within the last year, have you been humiliated or emotionally abused in other ways by your partner or ex-partner?: No    Within the last year, have you been kicked, hit, slapped, or otherwise physically hurt by your partner or ex-partner?: No    Within the last year, have you been raped or forced to have any kind of sexual activity by your partner or ex-partner?: No     PHYSICAL EXAM  Vitals:   11/14/23 0923  BP: 114/82  Pulse: 82  SpO2: 97%      There is no height or weight on file to calculate BMI.  No results found.   General: The patient is well-developed and well-nourished and in no acute distress  HEENT:  Head is [place]/AT.  Sclera are anicteric.    Neck: The neck is nontender.  Neck strength was normal.  Neck has a Good range of motion.  Skin: Extremities are without rash or  edema.  Neurologic Exam  Mental status: The patient is alert and oriented x 3 at the time of the examination.  The patient has apparent normal recent and remote memory, with an apparently normal attention span and concentration ability.      speech is normal.  Cranial nerves: Extraocular movements are full. There is no ptosis.  Facial strength was normal.  Facial sensation was normal.  Neck strength was normal.  No  obvious hearing deficits are noted.  Motor:  Muscle bulk is normal.   Tone is normal. Strength is  5 / 5 in all 4 extremities.   Gait and station: Station is normal.   Gait is normal. Tandem gait is normal. Romberg is negative.      DIAGNOSTIC DATA (LABS, IMAGING, TESTING) - I reviewed patient records, labs, notes, testing and imaging myself where available.  Lab Results  Component Value Date   WBC 8.1 11/03/2010   HGB 15.4 (H) 11/03/2010   HCT 44.2 11/03/2010   MCV 85.5 11/03/2010   PLT 267 11/03/2010      Component Value Date/Time   NA 139 11/03/2010 1730   K 3.5 11/03/2010 1730   CL 104 11/03/2010 1730   CO2 27 11/03/2010 1730   GLUCOSE 109 (H) 11/03/2010 1730   BUN 21 11/03/2010 1730   CREATININE 1.10 11/03/2010 1730   CALCIUM 10.4 11/03/2010 1730   GFRNONAA 55 (L) 11/03/2010 1730   GFRAA 63 (L) 11/03/2010 1730       ASSESSMENT AND PLAN  Chronic migraine w/o aura w/o status migrainosus, not intractable - Plan: botulinum toxin Type A  (BOTOX ) injection 155 Units  Insomnia, unspecified type  Neck pain  Gait disturbance  Diplopia  Inject Botox  as follows: Inject 125 Units into the muscles of the head and face as follows : Frontalis (6 x 5 units), corrugators (2 x 5 units), nasalis (1 x 5 unit), temporalis (5x 5 units on the right and 6 x 5 units on the left), occipitalis 4 x 5 units).   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    45 units wasted Continue Excedrin +/- sumatriptan  prn migraine Insomnia is doing well with doxepin  and Ambien .     RTC 3 months or sooner if new or worsening neurologic issues     Danyon Mcginness A. Vear, MD, [facility] 11/14/2023, 10:09 AM Certified in Neurology, Clinical Neurophysiology, Sleep Medicine and Neuroimaging  [facility] [address] [place], [place] [address] [phone]

## 2023-12-27 ENCOUNTER — Ambulatory Visit: Admitting: Neurology

## 2024-01-31 ENCOUNTER — Other Ambulatory Visit: Payer: Self-pay | Admitting: Neurology

## 2024-02-01 NOTE — Telephone Encounter (Signed)
 Requested Prescriptions   Pending Prescriptions Disp Refills   zolpidem  (AMBIEN ) 10 MG tablet [Pharmacy Med Name: ZOLPIDEM  TARTRATE 10 MG TABLET] 90 tablet 1    Sig: TAKE 1 TABLET BY MOUTH EVERYDAY AT BEDTIME   Last 11/14/23 Next appt  01/27/24  Dispenses   Dispensed Days Supply Quantity Provider Pharmacy  ZOLPIDEM  TARTRATE 10 MG TABLET 11/05/2023 90 90 each Vear Charlie LABOR, MD [facility] [phone] - A...  ZOLPIDEM  TARTRATE 10 MG TABLET 08/10/2023 90 90 each Sater, Charlie LABOR, MD [facility] [phone] - A...  ZOLPIDEM  TARTRATE 10 MG TABLET 05/15/2023 90 90 each Sater, Charlie LABOR, MD [facility] [phone] - A...  ZOLPIDEM  TARTRATE 10 MG TABLET 02/15/2023 90 90 each Sater, Charlie LABOR, MD [facility] [phone] - A.[facility]

## 2024-02-06 ENCOUNTER — Ambulatory Visit: Admitting: Neurology

## 2024-02-06 ENCOUNTER — Encounter: Payer: Self-pay | Admitting: Neurology

## 2024-02-06 VITALS — BP 119/78 | HR 77

## 2024-02-06 DIAGNOSIS — G4700 Insomnia, unspecified: Secondary | ICD-10-CM

## 2024-02-06 DIAGNOSIS — H8111 Benign paroxysmal vertigo, right ear: Secondary | ICD-10-CM | POA: Diagnosis not present

## 2024-02-06 DIAGNOSIS — G43709 Chronic migraine without aura, not intractable, without status migrainosus: Secondary | ICD-10-CM

## 2024-02-06 MED ORDER — ONABOTULINUMTOXINA 200 UNITS IJ SOLR
155.0000 [IU] | Freq: Once | INTRAMUSCULAR | Status: AC
  Administered 2024-02-06: 155 [IU] via INTRAMUSCULAR

## 2024-02-06 NOTE — Progress Notes (Signed)
 4  [facility]  PATIENT: Cindy Haley DOB: May 14, 1955  REFERRING DOCTOR OR PCP:  Darnelle Lefevre, MD (PCP) SOURCE: patient, notes from PCP  _________________________________   HISTORICAL  CHIEF COMPLAINT:  Chief Complaint  Patient presents with   RM10/BOTOX     Pt is here Alone.     HISTORY OF PRESENT ILLNESS:  Cindy Haley is a 69 y.o. woman with chronic migraines.  Update 02/06/2024: Her chronic migraines have gone well after her last Botox  injections 3 months ago.   She does very well for the first 10 weeks but has had 4 migraines the past couple weeks.  For migraines and takes 1/2 Imitrex  and Excedrin.  She takes Excedrin alone for milder HA.  Most HA occur in the morning and some awaken her.     She has needed to get the Botox  through [facility]  Before Botox , she had migraine 30/30 days a month.   Most HA's are most intense in the left orbital/temporal region.   Often, she wakes up with the headache and it sometimes awakens her.   When the migraine occurs she will sometimes get nausea.  She has photophobia and phonophobia.  Movement will worsen the pain.  Due to some neck weakness in the past we have used a lower amount of Botox  in the neck than the typical schedule.  When one occurs she takes Imitrex  and Excedrin and lays down.  Migraines always improve if she falls asleep .   Insomnia is doing well with the current combination of zolpidem  and doxepin .  She does not have any hangover when she wakes up in the morning.    She falls asleep fairly easily with Ambien .  If she wakes up she usually falls back asleep quickly.   Since starting doxepin , she notes that if she wakes up in the middle of the night, she is able to fall back asleep again.    She still gets occasioanl vertigo but this is much less of an issue than a couple years ago.     HISTORY OF MIGRAINE She has had migraine headaches since around 2000.  They became daily about 2010/2011 years ago.     A typical headache is above the right eye and has a throbbing quality.   She gets nausea and occasioanl vomiting.   She has photophobia, phonophobia and moving worsens the pain.    She wakes up most mornings with headaches.  She has HA 30/30 days and they are > 4 hours a day.    She has been taking sumatriptan  50 mg and Excedrin po qAM.   That combination helps the HA in the morning and she can usually work during the day.  The HA may return later in the day.    She takes a triptans with benefit when a migraine occurs.      In the past, she tried and failed the following: Anti-Epileptic drugs: Depakote, Keppra and Zonisamide.   Currently on topiramate  and gabapentin without much benefit Tricyclics/antidepressants: Amitriptyline, Duloxetine, Bupropion NSAIDs including piroxicam, Ibuprofen Steroid shots/pills Triptans: Imitrex  (short benefit only) Anti-CGRP agents:  Emgality  only helped slightly the first month and not with subsequent months.   Opiates (hydrocodone  (short benefit only).  Muscle relaxant: Baclofen Allergy and sinus med's: Claritin, Allegra, fluticasone spray Neurotoxins: Botox  has been helpful for the chronic migraines     CT 12/02/2017 report read 'negative intracranial imaging'  REVIEW OF SYSTEMS: Constitutional: No fevers, chills, sweats, or change in appetite.  She has  insomnia. Eyes: No visual changes, double vision, eye pain Ear, nose and throat: No hearing loss, ear pain, nasal congestion, sore throat Cardiovascular: No chest pain, palpitations Respiratory:  No shortness of breath at rest or with exertion.   No wheezes GastrointestinaI: No nausea, vomiting, diarrhea, abdominal pain, fecal incontinence Genitourinary:  No dysuria, urinary retention or frequency.  No nocturia. Musculoskeletal:  No neck pain, back pain Integumentary: No rash, pruritus, skin lesions Neurological: as above Psychiatric: No depression at this time.  No anxiety Endocrine: No palpitations,  diaphoresis, change in appetite, change in weigh or increased thirst Hematologic/Lymphatic:  No anemia, purpura, petechiae. Allergic/Immunologic: No itchy/runny eyes, nasal congestion, recent allergic reactions, rashes  ALLERGIES: Allergies  Allergen Reactions   Galcanezumab -Gnlm Itching    HOME MEDICATIONS:  Current Outpatient Medications:    Aspirin-Acetaminophen -Caffeine (EXCEDRIN PO), Take 2 tablets by mouth every morning. For pain, Disp: , Rfl:    botulinum toxin Type A  (BOTOX ) 200 units injection, Provider to inject 155 units into the muscles of the head and neck every 3 months. Discard remainder, Disp: 1 each, Rfl: 2   doxepin  (SINEQUAN ) 10 MG capsule, Take 1 capsule (10 mg total) by mouth at bedtime., Disp: 90 capsule, Rfl: 3   gabapentin (NEURONTIN) 400 MG capsule, Take 800 mg by mouth at bedtime., Disp: , Rfl:    levothyroxine (SYNTHROID, LEVOTHROID) 100 MCG tablet, Take 100 mcg by mouth daily., Disp: , Rfl:    SUMAtriptan  (IMITREX ) 100 MG tablet, TAKES 1/2 TO ONE TABLET AS NEEDED FOR MIGRAINE. NO MORE THAN 3 TIMES A WEEK, Disp: 15 tablet, Rfl: 11   topiramate  (TOPAMAX ) 100 MG tablet, Take 2 tablets (200 mg total) by mouth at bedtime., Disp: 60 tablet, Rfl: 3   zolpidem  (AMBIEN ) 10 MG tablet, TAKE 1 TABLET BY MOUTH EVERYDAY AT BEDTIME, Disp: 90 tablet, Rfl: 1  PAST MEDICAL HISTORY: Past Medical History:  Diagnosis Date   Migraine    Thyroid disease     PAST SURGICAL HISTORY: Past Surgical History:  Procedure Laterality Date   BREAST LUMPECTOMY     90's   BREAST REDUCTION SURGERY     90's   CESAREAN SECTION     1976, 1981   PARTIAL HYSTERECTOMY  1985   RECTAL SURGERY      FAMILY HISTORY: Family History  Problem Relation Age of Onset   Anuerysm Father    Kidney disease Brother    Alcohol abuse Brother     SOCIAL HISTORY:  Social History   Socioeconomic History   Marital status: Married    Spouse name: Deward   Number of children: 2   Years of education:  Master's   Highest education level: Not on file  Occupational History   Occupation: [employer]  Tobacco Use   Smoking status: Former   Smokeless tobacco: Never  Substance and Sexual Activity   Alcohol use: No   Drug use: Never   Sexual activity: Not on file  Other Topics Concern   Not on file  Social History Narrative   2 cups coffee per day   Lives with spouse   Right handed   Social Drivers of Health   Tobacco Use: Medium Risk (02/06/2024)   Patient History    Smoking Tobacco Use: Former    Smokeless Tobacco Use: Never    Passive Exposure: Not on Actuary Strain: Not on file  Food Insecurity: Low Risk (08/26/2023)   Received from [employer]  Within the past 12 months, you worried that your food would run out before you got money to buy more: Never true    Within the past 12 months, the food you bought just didn't last and you didn't have money to get more. : Never true  Transportation Needs: No Transportation Needs (08/26/2023)   Received from [facility]    In the past 12 months, has lack of reliable transportation kept you from medical appointments, meetings, work or from getting things needed for daily living? : No  Physical Activity: Not on file  Stress: Not on file  Social Connections: Not on file  Intimate Partner Violence: Not on file  Depression (EYV7-0): Not on file  Alcohol Screen: Not on file  Housing: Low Risk (08/26/2023)   Received from [facility]   Epic    What is your living situation today?: I have a steady place to live    Think about the place you live. Do you have problems with any of the following? Choose all that apply:: None/None on this list  Utilities: Low Risk (08/26/2023)   Received from [facility]   Utilities    In the past 12 months has the electric, gas, oil, or water company threatened to shut off services in your home? : No  Health Literacy: Not on file     PHYSICAL  EXAM  Vitals:   02/06/24 1337  BP: 119/78  Pulse: 77      There is no height or weight on file to calculate BMI.  No results found.   General: The patient is well-developed and well-nourished and in no acute distress  HEENT:  Head is [place]/AT.  Sclera are anicteric.    Neck: The neck is nontender.  Neck strength was normal.  Neck has a Good range of motion.  Skin: Extremities are without rash or  edema.  Neurologic Exam  Mental status: The patient is alert and oriented x 3 at the time of the examination.  The patient has apparent normal recent and remote memory, with an apparently normal attention span and concentration ability.      speech is normal.  Cranial nerves: Extraocular movements are full. There is no ptosis.  Facial strength was normal.  Facial sensation was normal.  Neck strength was normal.  No obvious hearing deficits are noted.  Motor:  Muscle bulk is normal.   Tone is normal. Strength is  5 / 5 in all 4 extremities.   Gait and station: Station is normal.   Gait is normal. Tandem gait is normal. Romberg is negative.      DIAGNOSTIC DATA (LABS, IMAGING, TESTING) - I reviewed patient records, labs, notes, testing and imaging myself where available.  Lab Results  Component Value Date   WBC 8.1 11/03/2010   HGB 15.4 (H) 11/03/2010   HCT 44.2 11/03/2010   MCV 85.5 11/03/2010   PLT 267 11/03/2010      Component Value Date/Time   NA 139 11/03/2010 1730   K 3.5 11/03/2010 1730   CL 104 11/03/2010 1730   CO2 27 11/03/2010 1730   GLUCOSE 109 (H) 11/03/2010 1730   BUN 21 11/03/2010 1730   CREATININE 1.10 11/03/2010 1730   CALCIUM 10.4 11/03/2010 1730   GFRNONAA 55 (L) 11/03/2010 1730   GFRAA 63 (L) 11/03/2010 1730       ASSESSMENT AND PLAN  Chronic migraine w/o aura w/o status migrainosus, not intractable - Plan: botulinum toxin Type A  (BOTOX )  injection 155 Units  Insomnia, unspecified type  Benign paroxysmal positional vertigo of right  ear  Inject Botox  155 units 63 so I am ready I have 1 to be Dr. Work, every other day. Yeah  Couple of the people I know pretty sure retired 0.65 as follows: Inject 125 units into the muscles of the head and face as follows : Frontalis (6 x 5 units), corrugators (2 x 5 units), nasalis (1 x 5 unit), temporalis (5x 5 units on the right and 6 x 5 units on the left), occipitalis 4 x 5 units).   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    45 units wasted Continue Excedrin +/- sumatriptan  prn migraine Insomnia is doing well.  Continue doxepin  and Ambien .     RTC 3 months or sooner if new or worsening neurologic issues     Janee Ureste A. Vear, MD, [facility] 02/06/2024, 5:45 PM Certified in Neurology, Clinical Neurophysiology, Sleep Medicine and Neuroimaging  [facility] [address] [place], [place] [address] [phone]

## 2024-02-06 NOTE — Progress Notes (Signed)
 Botox - 200 units x 1 vial Lot: [id number] Expiration: 03/2026 NDC: 9976-6078-97  Bacteriostatic 0.9% Sodium Chloride - 4 mL  Lot: [id number] Expiration: 04/24/2025 NDC: 9590-8033-97  Dx: H56.290 S/P

## 2024-05-01 ENCOUNTER — Ambulatory Visit: Admitting: Neurology
